# Patient Record
Sex: Female | Born: 1960 | Race: Black or African American | Hispanic: No | Marital: Single | Smoking: Current every day smoker
Health system: Southern US, Community
[De-identification: ages and names within clinical notes are randomized; demographics above are authoritative.]

## PROBLEM LIST (undated history)

## (undated) DIAGNOSIS — I2510 Atherosclerotic heart disease of native coronary artery without angina pectoris: Secondary | ICD-10-CM

## (undated) DIAGNOSIS — M069 Rheumatoid arthritis, unspecified: Secondary | ICD-10-CM

## (undated) DIAGNOSIS — M545 Low back pain, unspecified: Secondary | ICD-10-CM

## (undated) DIAGNOSIS — A048 Other specified bacterial intestinal infections: Secondary | ICD-10-CM

## (undated) DIAGNOSIS — K8020 Calculus of gallbladder without cholecystitis without obstruction: Secondary | ICD-10-CM

## (undated) DIAGNOSIS — M797 Fibromyalgia: Secondary | ICD-10-CM

## (undated) DIAGNOSIS — G8929 Other chronic pain: Secondary | ICD-10-CM

## (undated) DIAGNOSIS — M329 Systemic lupus erythematosus, unspecified: Secondary | ICD-10-CM

## (undated) DIAGNOSIS — I10 Essential (primary) hypertension: Secondary | ICD-10-CM

## (undated) DIAGNOSIS — I219 Acute myocardial infarction, unspecified: Secondary | ICD-10-CM

## (undated) DIAGNOSIS — R112 Nausea with vomiting, unspecified: Secondary | ICD-10-CM

## (undated) DIAGNOSIS — F419 Anxiety disorder, unspecified: Secondary | ICD-10-CM

## (undated) DIAGNOSIS — K5790 Diverticulosis of intestine, part unspecified, without perforation or abscess without bleeding: Secondary | ICD-10-CM

## (undated) DIAGNOSIS — E785 Hyperlipidemia, unspecified: Secondary | ICD-10-CM

## (undated) HISTORY — DX: Other specified bacterial intestinal infections: A04.8

## (undated) HISTORY — DX: Acute myocardial infarction, unspecified: I21.9

## (undated) HISTORY — DX: Hyperlipidemia, unspecified: E78.5

## (undated) HISTORY — DX: Calculus of gallbladder without cholecystitis without obstruction: K80.20

## (undated) HISTORY — DX: Diverticulosis of intestine, part unspecified, without perforation or abscess without bleeding: K57.90

## (undated) HISTORY — PX: MASTECTOMY: SHX3

## (undated) HISTORY — DX: Anxiety disorder, unspecified: F41.9

---

## 2000-03-26 ENCOUNTER — Encounter: Admission: RE | Admit: 2000-03-26 | Discharge: 2000-04-10 | Payer: Self-pay | Admitting: Orthopaedic Surgery

## 2000-07-04 ENCOUNTER — Inpatient Hospital Stay (HOSPITAL_COMMUNITY): Admission: EM | Admit: 2000-07-04 | Discharge: 2000-07-06 | Payer: Self-pay | Admitting: Emergency Medicine

## 2000-08-22 ENCOUNTER — Ambulatory Visit (HOSPITAL_COMMUNITY): Admission: RE | Admit: 2000-08-22 | Discharge: 2000-08-22 | Payer: Self-pay

## 2000-08-22 ENCOUNTER — Encounter: Admission: RE | Admit: 2000-08-22 | Discharge: 2000-08-22 | Payer: Self-pay | Admitting: Internal Medicine

## 2001-06-23 ENCOUNTER — Emergency Department (HOSPITAL_COMMUNITY): Admission: EM | Admit: 2001-06-23 | Discharge: 2001-06-23 | Payer: Self-pay

## 2001-11-26 ENCOUNTER — Encounter: Payer: Self-pay | Admitting: Family Medicine

## 2001-11-26 ENCOUNTER — Encounter: Admission: RE | Admit: 2001-11-26 | Discharge: 2001-11-26 | Payer: Self-pay | Admitting: Family Medicine

## 2001-12-15 ENCOUNTER — Encounter: Payer: Self-pay | Admitting: Family Medicine

## 2001-12-15 ENCOUNTER — Encounter: Admission: RE | Admit: 2001-12-15 | Discharge: 2001-12-15 | Payer: Self-pay | Admitting: Family Medicine

## 2002-02-02 ENCOUNTER — Inpatient Hospital Stay (HOSPITAL_COMMUNITY): Admission: EM | Admit: 2002-02-02 | Discharge: 2002-02-03 | Payer: Self-pay

## 2002-05-06 ENCOUNTER — Encounter: Admission: RE | Admit: 2002-05-06 | Discharge: 2002-05-06 | Payer: Self-pay | Admitting: Internal Medicine

## 2002-05-12 ENCOUNTER — Encounter: Payer: Self-pay | Admitting: Internal Medicine

## 2002-05-12 ENCOUNTER — Ambulatory Visit (HOSPITAL_COMMUNITY): Admission: RE | Admit: 2002-05-12 | Discharge: 2002-05-12 | Payer: Self-pay | Admitting: Internal Medicine

## 2002-05-30 ENCOUNTER — Inpatient Hospital Stay (HOSPITAL_COMMUNITY): Admission: EM | Admit: 2002-05-30 | Discharge: 2002-06-01 | Payer: Self-pay | Admitting: Emergency Medicine

## 2002-05-31 ENCOUNTER — Encounter: Payer: Self-pay | Admitting: Internal Medicine

## 2002-07-23 ENCOUNTER — Emergency Department (HOSPITAL_COMMUNITY): Admission: EM | Admit: 2002-07-23 | Discharge: 2002-07-23 | Payer: Self-pay | Admitting: Emergency Medicine

## 2002-08-19 ENCOUNTER — Encounter: Admission: RE | Admit: 2002-08-19 | Discharge: 2002-08-19 | Payer: Self-pay | Admitting: Internal Medicine

## 2002-12-20 ENCOUNTER — Encounter: Admission: RE | Admit: 2002-12-20 | Discharge: 2002-12-20 | Payer: Self-pay | Admitting: Internal Medicine

## 2002-12-20 ENCOUNTER — Encounter: Payer: Self-pay | Admitting: Internal Medicine

## 2003-04-20 ENCOUNTER — Emergency Department (HOSPITAL_COMMUNITY): Admission: EM | Admit: 2003-04-20 | Discharge: 2003-04-21 | Payer: Self-pay | Admitting: Emergency Medicine

## 2003-04-21 ENCOUNTER — Encounter: Admission: RE | Admit: 2003-04-21 | Discharge: 2003-04-21 | Payer: Self-pay | Admitting: Internal Medicine

## 2003-05-20 ENCOUNTER — Encounter: Admission: RE | Admit: 2003-05-20 | Discharge: 2003-05-20 | Payer: Self-pay | Admitting: Internal Medicine

## 2003-07-04 ENCOUNTER — Encounter: Admission: RE | Admit: 2003-07-04 | Discharge: 2003-07-04 | Payer: Self-pay | Admitting: Internal Medicine

## 2003-12-29 ENCOUNTER — Encounter: Admission: RE | Admit: 2003-12-29 | Discharge: 2003-12-29 | Payer: Self-pay | Admitting: Internal Medicine

## 2003-12-29 ENCOUNTER — Encounter (INDEPENDENT_AMBULATORY_CARE_PROVIDER_SITE_OTHER): Payer: Self-pay | Admitting: Internal Medicine

## 2004-01-03 ENCOUNTER — Encounter: Admission: RE | Admit: 2004-01-03 | Discharge: 2004-01-03 | Payer: Self-pay | Admitting: Internal Medicine

## 2004-04-07 ENCOUNTER — Emergency Department (HOSPITAL_COMMUNITY): Admission: EM | Admit: 2004-04-07 | Discharge: 2004-04-07 | Payer: Self-pay | Admitting: Emergency Medicine

## 2004-05-14 ENCOUNTER — Encounter: Admission: RE | Admit: 2004-05-14 | Discharge: 2004-05-14 | Payer: Self-pay | Admitting: Internal Medicine

## 2004-05-16 ENCOUNTER — Ambulatory Visit (HOSPITAL_COMMUNITY): Admission: RE | Admit: 2004-05-16 | Discharge: 2004-05-16 | Payer: Self-pay | Admitting: Internal Medicine

## 2004-05-16 ENCOUNTER — Encounter: Admission: RE | Admit: 2004-05-16 | Discharge: 2004-05-16 | Payer: Self-pay | Admitting: Sports Medicine

## 2004-11-09 ENCOUNTER — Ambulatory Visit: Payer: Self-pay | Admitting: Internal Medicine

## 2004-11-09 ENCOUNTER — Inpatient Hospital Stay (HOSPITAL_COMMUNITY): Admission: EM | Admit: 2004-11-09 | Discharge: 2004-11-10 | Payer: Self-pay | Admitting: *Deleted

## 2005-01-16 ENCOUNTER — Ambulatory Visit: Payer: Self-pay | Admitting: Internal Medicine

## 2005-01-16 ENCOUNTER — Ambulatory Visit (HOSPITAL_COMMUNITY): Admission: RE | Admit: 2005-01-16 | Discharge: 2005-01-16 | Payer: Self-pay | Admitting: Internal Medicine

## 2005-12-27 ENCOUNTER — Ambulatory Visit: Payer: Self-pay | Admitting: Internal Medicine

## 2006-09-01 ENCOUNTER — Encounter (INDEPENDENT_AMBULATORY_CARE_PROVIDER_SITE_OTHER): Payer: Self-pay | Admitting: Internal Medicine

## 2006-09-01 DIAGNOSIS — M899 Disorder of bone, unspecified: Secondary | ICD-10-CM | POA: Insufficient documentation

## 2006-09-01 DIAGNOSIS — M949 Disorder of cartilage, unspecified: Secondary | ICD-10-CM

## 2006-09-01 DIAGNOSIS — M329 Systemic lupus erythematosus, unspecified: Secondary | ICD-10-CM | POA: Insufficient documentation

## 2006-09-01 DIAGNOSIS — G729 Myopathy, unspecified: Secondary | ICD-10-CM

## 2006-09-01 DIAGNOSIS — I10 Essential (primary) hypertension: Secondary | ICD-10-CM | POA: Insufficient documentation

## 2006-09-01 DIAGNOSIS — IMO0001 Reserved for inherently not codable concepts without codable children: Secondary | ICD-10-CM

## 2006-09-01 DIAGNOSIS — K219 Gastro-esophageal reflux disease without esophagitis: Secondary | ICD-10-CM

## 2006-09-01 DIAGNOSIS — N951 Menopausal and female climacteric states: Secondary | ICD-10-CM

## 2006-09-01 DIAGNOSIS — M545 Low back pain: Secondary | ICD-10-CM

## 2006-09-01 DIAGNOSIS — E881 Lipodystrophy, not elsewhere classified: Secondary | ICD-10-CM

## 2006-10-16 ENCOUNTER — Telehealth: Payer: Self-pay | Admitting: *Deleted

## 2007-01-07 ENCOUNTER — Ambulatory Visit: Payer: Self-pay | Admitting: Internal Medicine

## 2007-01-07 DIAGNOSIS — M549 Dorsalgia, unspecified: Secondary | ICD-10-CM | POA: Insufficient documentation

## 2007-01-07 DIAGNOSIS — R350 Frequency of micturition: Secondary | ICD-10-CM

## 2007-01-08 ENCOUNTER — Encounter (INDEPENDENT_AMBULATORY_CARE_PROVIDER_SITE_OTHER): Payer: Self-pay | Admitting: Internal Medicine

## 2007-01-08 LAB — CONVERTED CEMR LAB
Albumin: 4.4 g/dL (ref 3.5–5.2)
Alkaline Phosphatase: 66 units/L (ref 39–117)
BUN: 12 mg/dL (ref 6–23)
Benzodiazepines.: NEGATIVE
Bilirubin Urine: NEGATIVE
CO2: 25 meq/L (ref 19–32)
Creatinine,U: 168.5 mg/dL
Glucose, Bld: 78 mg/dL (ref 70–99)
Leukocytes, UA: NEGATIVE
Marijuana Metabolite: POSITIVE — AB
Methadone: NEGATIVE
Phencyclidine (PCP): NEGATIVE
Potassium: 4.4 meq/L (ref 3.5–5.3)
Propoxyphene: NEGATIVE
Protein, ur: NEGATIVE mg/dL
Specific Gravity, Urine: 1.022 (ref 1.005–1.03)
Total Bilirubin: 0.2 mg/dL — ABNORMAL LOW (ref 0.3–1.2)
Urobilinogen, UA: 0.2 (ref 0.0–1.0)

## 2007-01-14 ENCOUNTER — Ambulatory Visit (HOSPITAL_COMMUNITY): Admission: RE | Admit: 2007-01-14 | Discharge: 2007-01-14 | Payer: Self-pay | Admitting: Gynecology

## 2007-01-14 ENCOUNTER — Encounter (INDEPENDENT_AMBULATORY_CARE_PROVIDER_SITE_OTHER): Payer: Self-pay | Admitting: Internal Medicine

## 2007-01-15 ENCOUNTER — Telehealth (INDEPENDENT_AMBULATORY_CARE_PROVIDER_SITE_OTHER): Payer: Self-pay | Admitting: *Deleted

## 2007-09-17 DIAGNOSIS — I219 Acute myocardial infarction, unspecified: Secondary | ICD-10-CM

## 2007-09-17 HISTORY — PX: CORONARY ANGIOPLASTY WITH STENT PLACEMENT: SHX49

## 2007-09-17 HISTORY — DX: Acute myocardial infarction, unspecified: I21.9

## 2008-02-23 ENCOUNTER — Ambulatory Visit: Payer: Self-pay | Admitting: Hospitalist

## 2008-02-23 ENCOUNTER — Inpatient Hospital Stay (HOSPITAL_COMMUNITY): Admission: EM | Admit: 2008-02-23 | Discharge: 2008-02-24 | Payer: Self-pay | Admitting: Emergency Medicine

## 2008-02-25 ENCOUNTER — Encounter (INDEPENDENT_AMBULATORY_CARE_PROVIDER_SITE_OTHER): Payer: Self-pay | Admitting: Internal Medicine

## 2008-03-03 ENCOUNTER — Ambulatory Visit: Payer: Self-pay | Admitting: Internal Medicine

## 2008-03-03 ENCOUNTER — Encounter (INDEPENDENT_AMBULATORY_CARE_PROVIDER_SITE_OTHER): Payer: Self-pay | Admitting: Internal Medicine

## 2008-03-03 DIAGNOSIS — F17200 Nicotine dependence, unspecified, uncomplicated: Secondary | ICD-10-CM | POA: Insufficient documentation

## 2008-03-03 DIAGNOSIS — K7689 Other specified diseases of liver: Secondary | ICD-10-CM

## 2008-03-03 DIAGNOSIS — D4959 Neoplasm of unspecified behavior of other genitourinary organ: Secondary | ICD-10-CM | POA: Insufficient documentation

## 2008-03-04 ENCOUNTER — Encounter (INDEPENDENT_AMBULATORY_CARE_PROVIDER_SITE_OTHER): Payer: Self-pay | Admitting: Internal Medicine

## 2008-03-04 ENCOUNTER — Telehealth: Payer: Self-pay | Admitting: Licensed Clinical Social Worker

## 2008-03-04 LAB — CONVERTED CEMR LAB
AST: 15 units/L (ref 0–37)
Albumin: 4 g/dL (ref 3.5–5.2)
BUN: 8 mg/dL (ref 6–23)
Basophils Relative: 0 % (ref 0–1)
CO2: 23 meq/L (ref 19–32)
Calcium: 9 mg/dL (ref 8.4–10.5)
Chloride: 104 meq/L (ref 96–112)
Creatinine, Ser: 0.7 mg/dL (ref 0.40–1.20)
Hemoglobin: 11.9 g/dL — ABNORMAL LOW (ref 12.0–15.0)
Lymphocytes Relative: 27 % (ref 12–46)
Lymphs Abs: 1.9 10*3/uL (ref 0.7–4.0)
Monocytes Absolute: 0.8 10*3/uL (ref 0.1–1.0)
Monocytes Relative: 11 % (ref 3–12)
Neutro Abs: 4.1 10*3/uL (ref 1.7–7.7)
Neutrophils Relative %: 58 % (ref 43–77)
Potassium: 4 meq/L (ref 3.5–5.3)
RBC: 3.77 M/uL — ABNORMAL LOW (ref 3.87–5.11)
WBC: 7 10*3/uL (ref 4.0–10.5)

## 2008-06-01 ENCOUNTER — Ambulatory Visit (HOSPITAL_COMMUNITY): Admission: RE | Admit: 2008-06-01 | Discharge: 2008-06-01 | Payer: Self-pay | Admitting: Internal Medicine

## 2008-06-27 ENCOUNTER — Telehealth (INDEPENDENT_AMBULATORY_CARE_PROVIDER_SITE_OTHER): Payer: Self-pay | Admitting: Internal Medicine

## 2008-07-04 ENCOUNTER — Inpatient Hospital Stay (HOSPITAL_COMMUNITY): Admission: EM | Admit: 2008-07-04 | Discharge: 2008-07-08 | Payer: Self-pay | Admitting: Emergency Medicine

## 2008-07-04 DIAGNOSIS — I252 Old myocardial infarction: Secondary | ICD-10-CM

## 2008-07-25 ENCOUNTER — Emergency Department (HOSPITAL_COMMUNITY): Admission: EM | Admit: 2008-07-25 | Discharge: 2008-07-25 | Payer: Self-pay | Admitting: Family Medicine

## 2008-08-01 ENCOUNTER — Telehealth: Payer: Self-pay | Admitting: *Deleted

## 2008-08-01 ENCOUNTER — Telehealth (INDEPENDENT_AMBULATORY_CARE_PROVIDER_SITE_OTHER): Payer: Self-pay | Admitting: *Deleted

## 2008-08-04 ENCOUNTER — Encounter (INDEPENDENT_AMBULATORY_CARE_PROVIDER_SITE_OTHER): Payer: Self-pay | Admitting: Internal Medicine

## 2008-08-04 ENCOUNTER — Ambulatory Visit: Payer: Self-pay | Admitting: *Deleted

## 2008-08-04 ENCOUNTER — Ambulatory Visit (HOSPITAL_COMMUNITY): Admission: RE | Admit: 2008-08-04 | Discharge: 2008-08-04 | Payer: Self-pay | Admitting: *Deleted

## 2008-08-23 ENCOUNTER — Emergency Department (HOSPITAL_COMMUNITY): Admission: EM | Admit: 2008-08-23 | Discharge: 2008-08-23 | Payer: Self-pay | Admitting: Emergency Medicine

## 2008-11-17 ENCOUNTER — Encounter (INDEPENDENT_AMBULATORY_CARE_PROVIDER_SITE_OTHER): Payer: Self-pay | Admitting: Internal Medicine

## 2008-11-17 ENCOUNTER — Ambulatory Visit: Payer: Self-pay | Admitting: Internal Medicine

## 2008-11-17 LAB — CONVERTED CEMR LAB: Hgb A1c MFr Bld: 5.6 %

## 2008-11-22 DIAGNOSIS — D72829 Elevated white blood cell count, unspecified: Secondary | ICD-10-CM | POA: Insufficient documentation

## 2008-11-22 LAB — CONVERTED CEMR LAB
ALT: 22 units/L (ref 0–35)
AST: 22 units/L (ref 0–37)
Band Neutrophils: 0 % (ref 0–10)
Basophils Relative: 0 % (ref 0–1)
Creatinine, Ser: 0.73 mg/dL (ref 0.40–1.20)
Eosinophils Absolute: 0.2 10*3/uL (ref 0.0–0.7)
Hemoglobin: 11.7 g/dL — ABNORMAL LOW (ref 12.0–15.0)
LDL Cholesterol: 17 mg/dL (ref 0–99)
Lymphs Abs: 1.9 10*3/uL (ref 0.7–4.0)
MCHC: 33.1 g/dL (ref 30.0–36.0)
Neutro Abs: 9.1 10*3/uL — ABNORMAL HIGH (ref 1.7–7.7)
Neutrophils Relative %: 77 % (ref 43–77)
RBC: 3.82 M/uL — ABNORMAL LOW (ref 3.87–5.11)
RDW: 13.6 % (ref 11.5–15.5)
Sodium: 141 meq/L (ref 135–145)
TSH: 1.353 microintl units/mL (ref 0.350–4.50)
Total Bilirubin: 0.2 mg/dL — ABNORMAL LOW (ref 0.3–1.2)
Total CHOL/HDL Ratio: 1.4
Total Protein: 7.8 g/dL (ref 6.0–8.3)
VLDL: 10 mg/dL (ref 0–40)

## 2008-12-08 ENCOUNTER — Encounter (INDEPENDENT_AMBULATORY_CARE_PROVIDER_SITE_OTHER): Payer: Self-pay | Admitting: Internal Medicine

## 2008-12-08 ENCOUNTER — Ambulatory Visit: Payer: Self-pay | Admitting: *Deleted

## 2008-12-08 LAB — CONVERTED CEMR LAB
Anticardiolipin IgA: 19 (ref <13)
Candida species: NEGATIVE
Chlamydia, DNA Probe: NEGATIVE
GC Probe Amp, Genital: NEGATIVE
HCT: 36.9 % (ref 36.0–46.0)
Hemoglobin: 12.7 g/dL (ref 12.0–15.0)
Platelets: 268 10*3/uL (ref 150–400)
RBC: 3.9 M/uL (ref 3.87–5.11)
WBC: 6 10*3/uL (ref 4.0–10.5)

## 2009-02-08 ENCOUNTER — Telehealth (INDEPENDENT_AMBULATORY_CARE_PROVIDER_SITE_OTHER): Payer: Self-pay | Admitting: Internal Medicine

## 2009-02-15 ENCOUNTER — Encounter (INDEPENDENT_AMBULATORY_CARE_PROVIDER_SITE_OTHER): Payer: Self-pay | Admitting: Internal Medicine

## 2009-02-16 ENCOUNTER — Encounter (INDEPENDENT_AMBULATORY_CARE_PROVIDER_SITE_OTHER): Payer: Self-pay | Admitting: Internal Medicine

## 2009-02-16 ENCOUNTER — Ambulatory Visit: Payer: Self-pay | Admitting: Internal Medicine

## 2009-02-16 DIAGNOSIS — G4700 Insomnia, unspecified: Secondary | ICD-10-CM | POA: Insufficient documentation

## 2009-02-16 LAB — CONVERTED CEMR LAB
AST: 30 units/L (ref 0–37)
Albumin: 4.3 g/dL (ref 3.5–5.2)
BUN: 10 mg/dL (ref 6–23)
CO2: 20 meq/L (ref 19–32)
Calcium: 9.2 mg/dL (ref 8.4–10.5)
Chloride: 103 meq/L (ref 96–112)
Potassium: 3.9 meq/L (ref 3.5–5.3)
Total CK: 50 units/L (ref 7–177)

## 2009-04-06 ENCOUNTER — Telehealth: Payer: Self-pay | Admitting: Internal Medicine

## 2009-05-16 ENCOUNTER — Telehealth (INDEPENDENT_AMBULATORY_CARE_PROVIDER_SITE_OTHER): Payer: Self-pay | Admitting: *Deleted

## 2009-05-25 ENCOUNTER — Telehealth (INDEPENDENT_AMBULATORY_CARE_PROVIDER_SITE_OTHER): Payer: Self-pay | Admitting: *Deleted

## 2009-05-25 ENCOUNTER — Telehealth: Payer: Self-pay | Admitting: *Deleted

## 2009-09-19 ENCOUNTER — Emergency Department (HOSPITAL_COMMUNITY): Admission: EM | Admit: 2009-09-19 | Discharge: 2009-09-19 | Payer: Self-pay | Admitting: Emergency Medicine

## 2009-09-22 ENCOUNTER — Emergency Department (HOSPITAL_COMMUNITY): Admission: EM | Admit: 2009-09-22 | Discharge: 2009-09-22 | Payer: Self-pay | Admitting: Family Medicine

## 2009-09-26 ENCOUNTER — Emergency Department (HOSPITAL_COMMUNITY): Admission: EM | Admit: 2009-09-26 | Discharge: 2009-09-26 | Payer: Self-pay | Admitting: Emergency Medicine

## 2009-10-06 ENCOUNTER — Emergency Department (HOSPITAL_COMMUNITY): Admission: EM | Admit: 2009-10-06 | Discharge: 2009-10-06 | Payer: Self-pay | Admitting: Emergency Medicine

## 2009-12-01 ENCOUNTER — Encounter: Payer: Self-pay | Admitting: Internal Medicine

## 2009-12-01 ENCOUNTER — Inpatient Hospital Stay (HOSPITAL_COMMUNITY): Admission: EM | Admit: 2009-12-01 | Discharge: 2009-12-03 | Payer: Self-pay | Admitting: Emergency Medicine

## 2009-12-03 ENCOUNTER — Encounter (INDEPENDENT_AMBULATORY_CARE_PROVIDER_SITE_OTHER): Payer: Self-pay | Admitting: Internal Medicine

## 2009-12-06 ENCOUNTER — Telehealth (INDEPENDENT_AMBULATORY_CARE_PROVIDER_SITE_OTHER): Payer: Self-pay | Admitting: Internal Medicine

## 2010-03-05 ENCOUNTER — Telehealth: Payer: Self-pay | Admitting: Internal Medicine

## 2010-05-05 ENCOUNTER — Ambulatory Visit: Payer: Self-pay | Admitting: Internal Medicine

## 2010-05-05 ENCOUNTER — Inpatient Hospital Stay (HOSPITAL_COMMUNITY): Admission: EM | Admit: 2010-05-05 | Discharge: 2010-05-07 | Payer: Self-pay | Admitting: Internal Medicine

## 2010-05-05 ENCOUNTER — Encounter: Payer: Self-pay | Admitting: Internal Medicine

## 2010-06-06 IMAGING — US US ABDOMEN LIMITED
1 series · 14 of 25 positions shown · non-contrast
Comparison: 02/23/2008 ultrasound and MRI

CLINICAL DATA: 46-year-old female small left hepatic lesions,
follow-up exam

ABDOMEN ULTRASOUND LIMITED

[Series 1: unknown · 0.28mm/px · 14 of 30 slices shown]
[im 1/30]
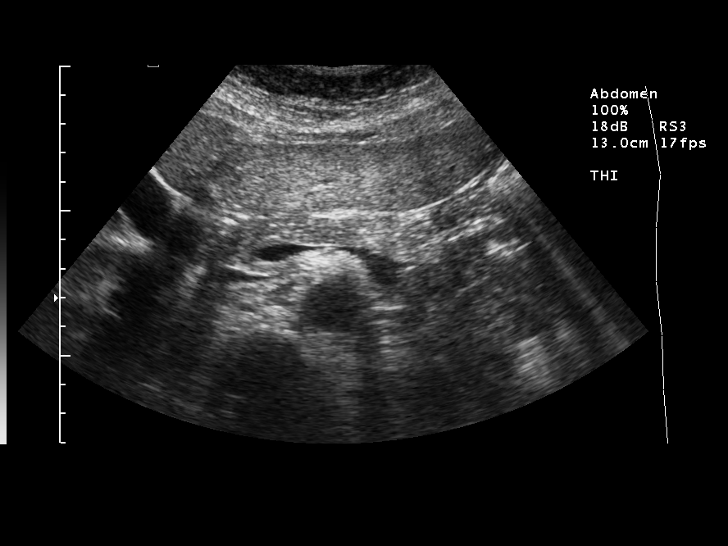
[im 3/30]
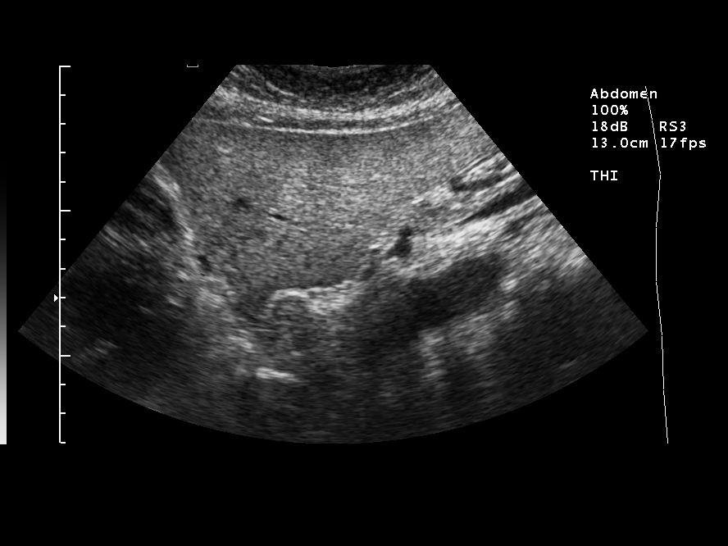
[im 5/30]
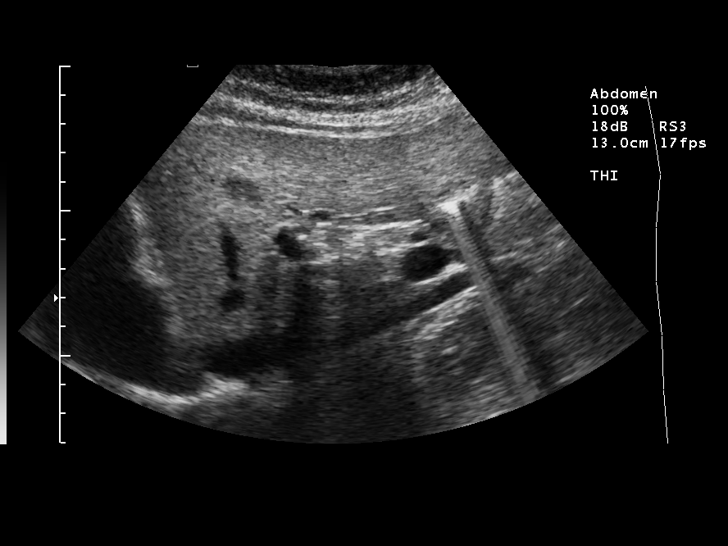
[im 8/30]
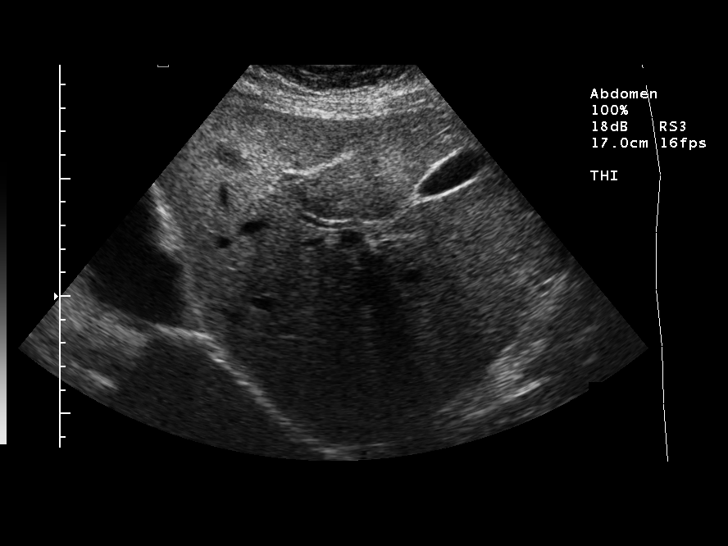
[im 10/30]
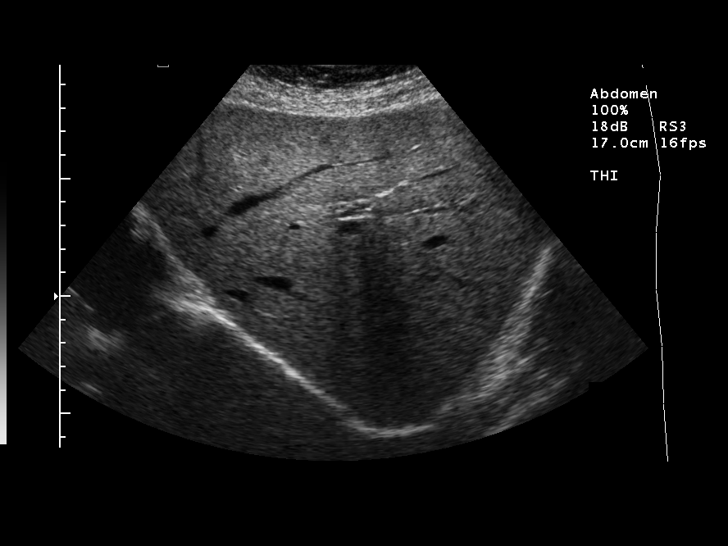
[im 11/30]
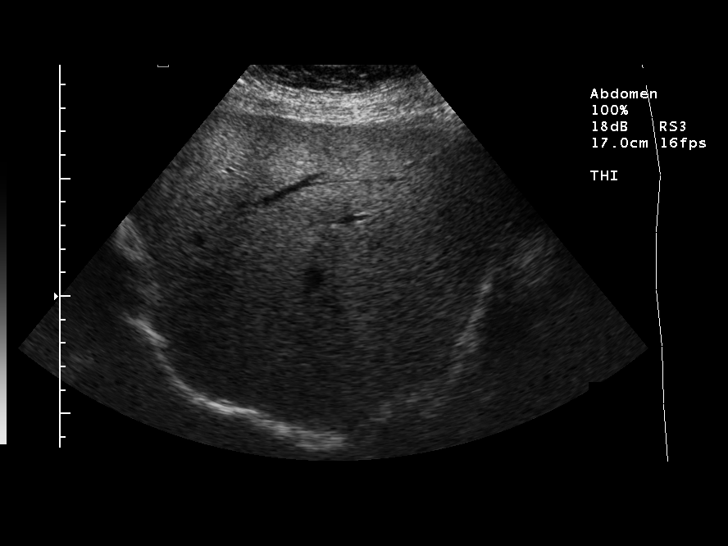
[im 14/30]
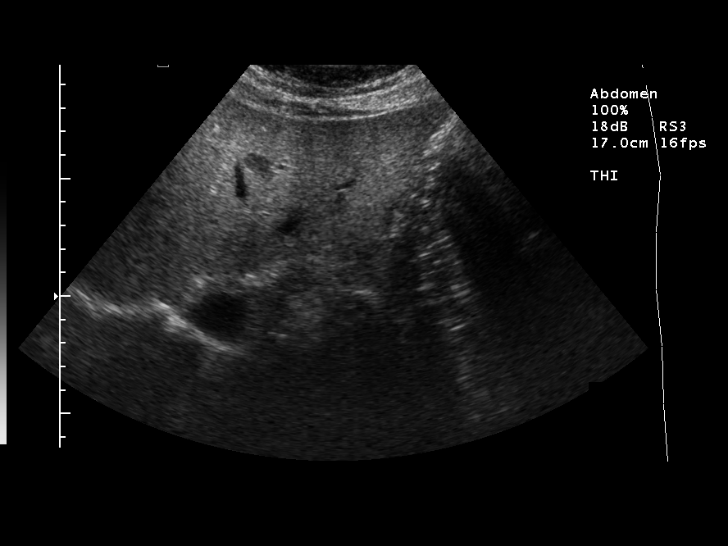
[im 16/30]
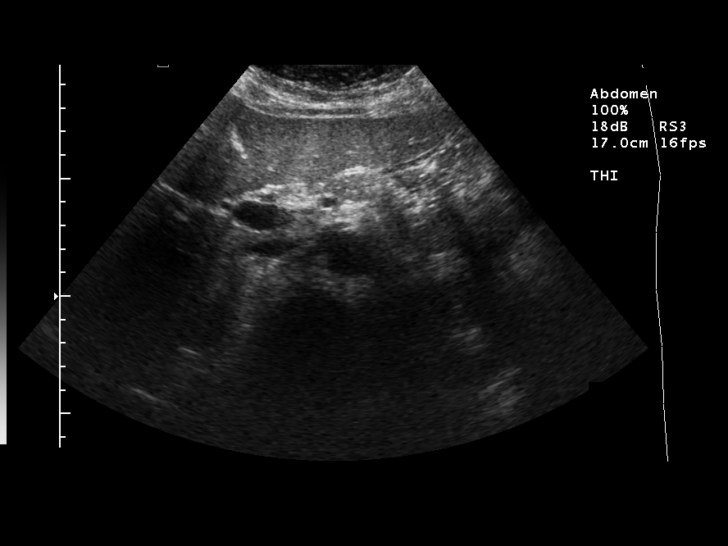
[im 19/30]
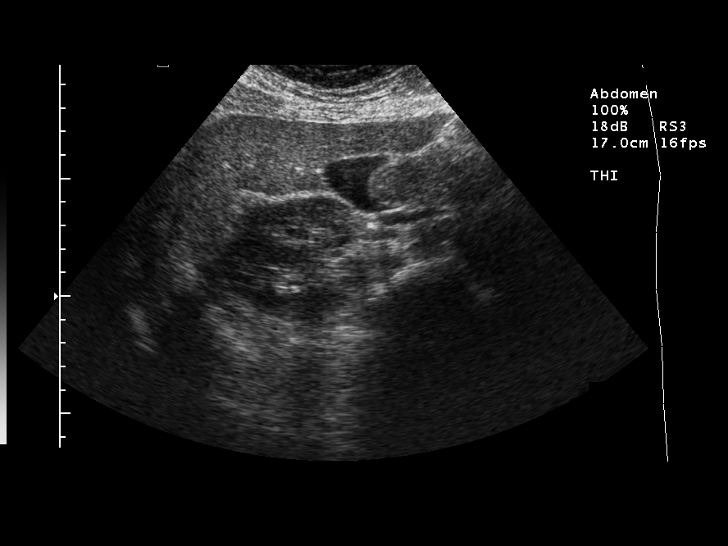
[im 20/30]
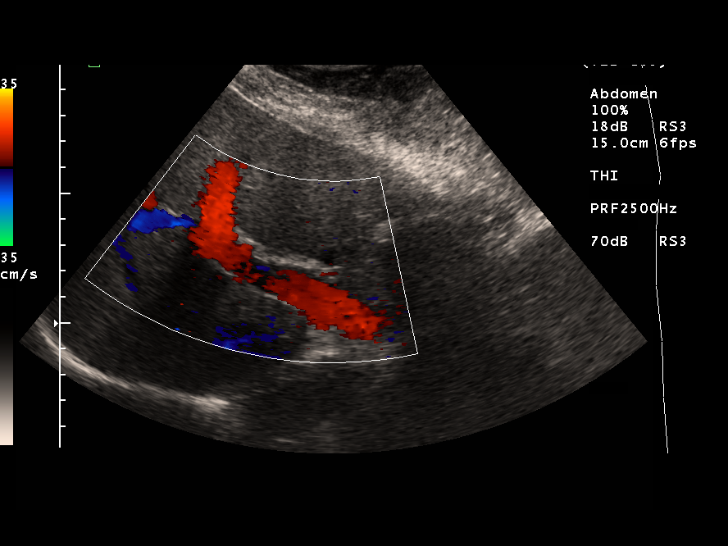
[im 22/30]
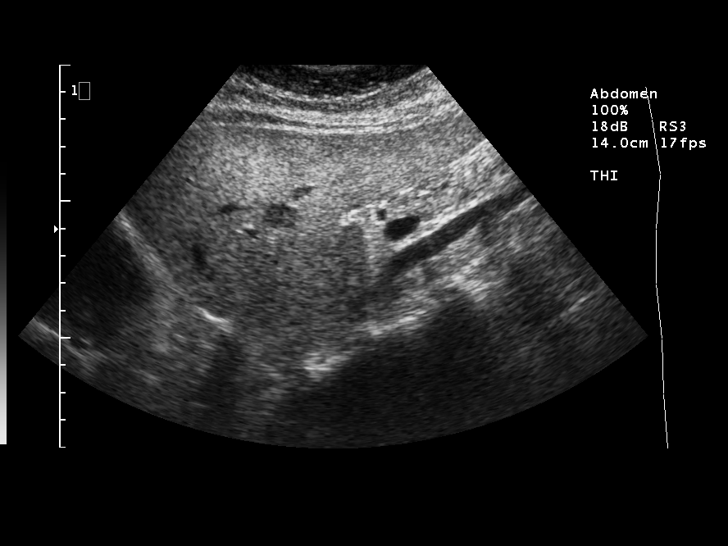
[im 25/30]
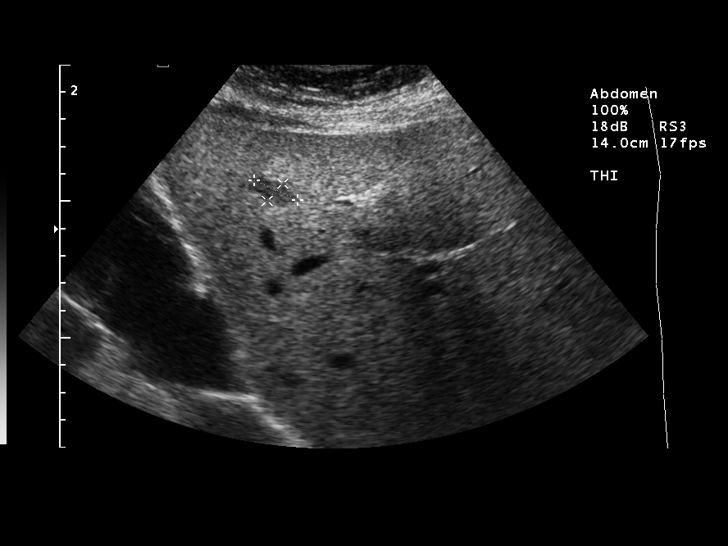
[im 27/30]
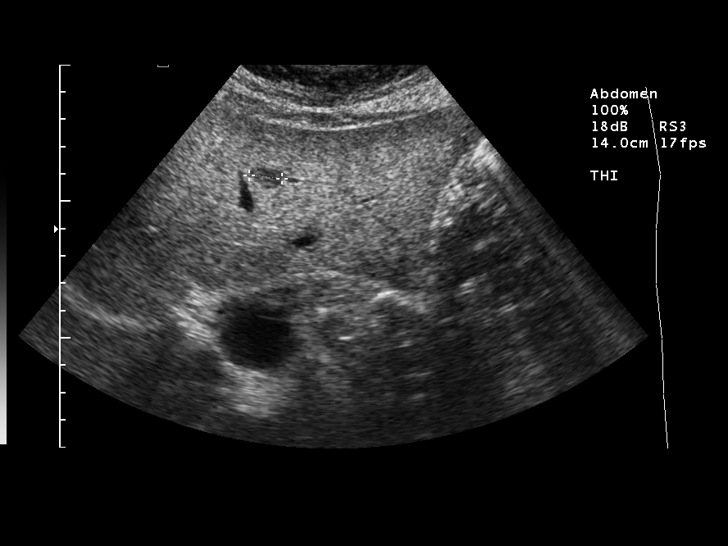
[im 30/30]
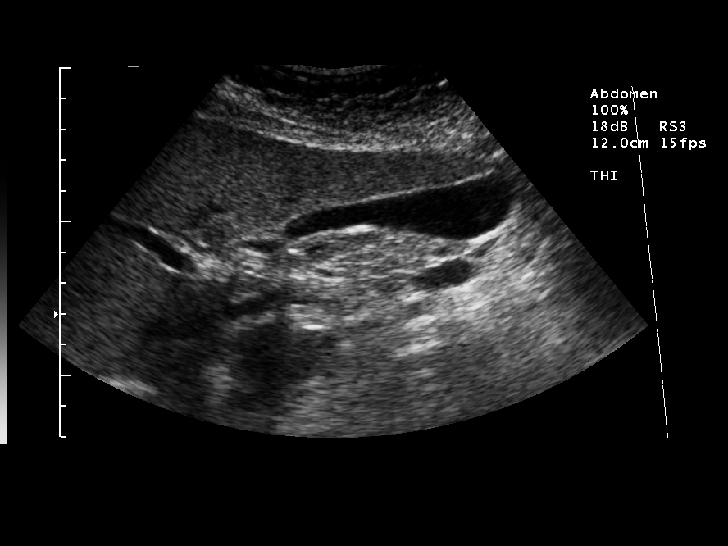

[14 of 25 positions shown; findings below may reference images not displayed]

FINDINGS: The liver demonstrates two small hypoechoic solid nodules
one measuring 15 mm and second measuring 17 mm.  These appear
stable compared to 02/23/2008.  No additional lesions visualized.
No biliary dilatation.  No interval enlargement of the nodules.  No
free fluid.  Gallbladder is normal.
IMPRESSION: 2 small stable hypoechoic liver nodules compared to 02/23/2008

## 2010-08-09 ENCOUNTER — Encounter: Payer: Self-pay | Admitting: Internal Medicine

## 2010-08-09 ENCOUNTER — Inpatient Hospital Stay (HOSPITAL_COMMUNITY): Admission: EM | Admit: 2010-08-09 | Discharge: 2010-08-10 | Payer: Self-pay | Admitting: Internal Medicine

## 2010-08-09 ENCOUNTER — Encounter: Payer: Self-pay | Admitting: Emergency Medicine

## 2010-08-10 ENCOUNTER — Encounter: Payer: Self-pay | Admitting: Internal Medicine

## 2010-08-20 ENCOUNTER — Telehealth (INDEPENDENT_AMBULATORY_CARE_PROVIDER_SITE_OTHER): Payer: Self-pay | Admitting: *Deleted

## 2010-08-30 ENCOUNTER — Ambulatory Visit (HOSPITAL_COMMUNITY)
Admission: RE | Admit: 2010-08-30 | Discharge: 2010-08-30 | Payer: Self-pay | Source: Home / Self Care | Attending: Internal Medicine | Admitting: Internal Medicine

## 2010-10-07 ENCOUNTER — Encounter: Payer: Self-pay | Admitting: Internal Medicine

## 2010-10-16 NOTE — Progress Notes (Signed)
Summary: NO LONGER A PATIENT  Phone Note Call from Patient   Summary of Call: Patient has called in and stated she is no longer  patient of this clinic.  She now sees Dr. Lonia Blood. Initial call taken by: Shon Hough,  August 20, 2010 8:56 AM

## 2010-10-16 NOTE — Miscellaneous (Signed)
Summary: Hospital Admission  INTERNAL MEDICINE ADMISSION HISTORY AND PHYSICAL PCP: Unasigned [id number], Vomiting HPI: [name] is a 50 year old woman with significant past medical history for CAD, Discoid Lupus, GERD that went to  [facility] emergency department complaining of nausea and vomiting. It started yesterday morning, is yellow, non bloody in nature . According to her she vomited up 50-60times. She also had 3-4 episodes of watery, nonbloddy diarrhea yesterday. Also complains of fever and chills that started yesterday morning also. Palpitations and dizziness for the same period of time are also present. Upon further questioning she admits weight loss of approximately 20-25 pounds in the last year. Night sweats are also present for the last 6 months.approximately.   Patient received in the ED IV fluids, Zofran and Vicodin.  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   MEDICATIONS: Current Meds:  METOPROLOL TARTRATE 50 MG TABS (METOPROLOL TARTRATE) Take 1 tablet by mouth two times a day SIMVASTATIN 20 MG TABS (SIMVASTATIN) Take 1 tab by mouth at bedtime NITROGLYCERIN 0.4 MG SUBL (NITROGLYCERIN) as needed PLAVIX 75 MG TABS (CLOPIDOGREL BISULFATE) Take 1 tablet by mouth once a day ANACIN 81 MG TBEC (ASPIRIN) Take 1 tablet by mouth once a  day COZAAR 50 MG TABS (LOSARTAN POTASSIUM) Take 1 tablet by mouth once a day AMBIEN 5 MG TABS (ZOLPIDEM TARTRATE) bedtime.   SOCIAL HISTORY: Smokes half pack per day, for 30years Max drinks per week: 3  FAMILY HISTORY  Mother passed away @ 45. Ca Stomach Brother had brain ca   ROS (only + for ): Tooth pain Dyspnea on exertion Cough Sputum: white  Anorexia Dysphagia Joint Pain  VITALS: T: 97.6 P:65  BP:109/70  R: 20 O2SAT:98  ON:RA PHYSICAL EXAM: General:  alert, well-developed, and cooperative to examination.   Head:  normocephalic and atraumatic.   Eyes:  vision grossly intact, pupils equal, pupils round, pupils reactive to light, no injection and anicteric.   Mouth:  pharynx pink and moist, no erythema, and no exudates.   Neck:  supple, full ROM, no thyromegaly, no JVD, and no carotid bruits.   Lungs:  normal respiratory effort, no accessory muscle use, normal breath sounds, no crackles, and no wheezes. CV: RRR, no M, S3, S4, no lifts or rubs. Abdomen: ND; BS+, NTTP, no HSM MSK: FROM of all extremities proximately and distally bialterally; no joint erythema, effusion or increased warmth to touch bilaterally. Neurologic:  alert & oriented X3, cranial nerves II-XII intact, strength normal in all extremities, sensation intact to light touch, and gait normal.   Skin:  turgor normal and no rashes.   Psych:  Oriented X3, memory intact for recent and remote, normally interactive, good eye contact, not anxious appearing, and not depressed appearing.  LABS:  CBC.  WBC  12.4       h      4.0-10.5         K/uL  RBC                                      4.31              3.87-5.11        MIL/uL  Hemoglobin (HGB)                         13.9              12.0-15.0        g/dL  Hematocrit (HCT)                         39.8              36.0-46.0        %  MCV                                      92.5              78.0-100.0       fL  MCH -                                     32.4              26.0-34.0        pg  MCHC                                     35.0              30.0-36.0        g/dL  RDW                                      14.5              11.5-15.5        %  Platelet Count (PLT)                     346               150-400          K/uL  Neutrophils, %                           91         h      43-77            %  Lymphocytes, %                           7          l      12-46            %  Monocytes, %  2          l      3-12             %  Eosinophils, %                           0                 0-5              %  Basophils, %                             0                 0-1              %  Neutrophils, Absolute                    11.3       h      1.7-7.7          K/uL  Lymphocytes, Absolute                    0.9               0.7-4.0          K/uL  Monocytes, Absolute                      0.2               0.1-1.0          K/uL  Eosinophils, Absolute                    0.0               0.0-0.7          K/uL  Basophils, Absolute                      0.0               0.0-0.1          K/uL  Urinalysis  Color, Urine                             AMBER      a      YELLOW    BIOCHEMICALS MAY BE AFFECTED BY COLOR  Appearance                               CLOUDY     a      CLEAR  Specific Gravity                         1.028             1.005-1.030  pH                                       6.0               5.0-8.0  Urine Glucose  NEGATIVE          NEG              mg/dL  Bilirubin                                NEGATIVE          NEG  Ketones                                  NEGATIVE          NEG              mg/dL  Blood                                    SMALL      a      NEG  Protein                                  100        a      NEG              mg/dL  Urobilinogen                             0.2               0.0-1.0          mg/dL  Nitrite                                   NEGATIVE          NEG  Leukocytes                               NEGATIVE          NEG  CMP   Sodium (NA)                              145               135-145          mEq/L  Potassium (K)                            4.2               3.5-5.1          mEq/L  Chloride                                 106               96-112           mEq/L  CO2  22                19-32            mEq/L  Glucose                                  141        h      70-99            mg/dL  BUN                                      20                6-23             mg/dL  Creatinine                               0.77              0.4-1.2          mg/dL  GFR, Est Non African American            >60               >60              mL/min  GFR, Est African American                >60               >60              mL/min    Oversized comment, see footnote  1  Bilirubin, Total                         0.7               0.3-1.2          mg/dL  Alkaline Phosphatase                     64                39-117           U/L  SGOT (AST)                               36                0-37             U/L  SGPT (ALT)                               32                0-35             U/L  Total  Protein                           9.4        h      6.0-8.3  g/dL  Albumin-Blood                            5.0               3.5-5.2          g/dL  Calcium                                  10.1              8.4-10.5         mg/dL  Urine microscopic  Squamous Epithelial / LPF                MANY       a      RARE  Casts / HPF                              SEE NOTE.  a      NEG    HYALINE CASTS  WBC / HPF                                3-6               <3               WBC/hpf  RBC / HPF                                3-6               <3               RBC/hpf  Bacteria / HPF                           MANY       a      RARE  Urine-Other                              SEE  NOTE.    MUCOUS PRESENT  Abdominal Xray: Findings: The lungs are well-aerated and clear.  There is no   evidence of focal opacification, pleural effusion or pneumothorax.   The cardiomediastinal silhouette is within normal limits.    The visualized bowel gas pattern is unremarkable.  There is a   relative paucity of bowel gas within the abdomen; there is no   evidence of small bowel dilatation to suggest obstruction.  No free   intra-abdominal air is identified on the provided upright view.    No acute osseous abnormalities are seen; the sacroiliac joints are   unremarkable in appearance.  Calcification is noted along the   abdominal aorta, advanced for age.    IMPRESSION:    1.  Unremarkable bowel gas pattern; relative paucity of bowel gas   in the abdomen.  No free intra-abdominal air seen.   2.  No acute cardiopulmonary process identified.   3.  Calcification along the abdominal aorta, advanced for age.  ASSESSMENT AND PLAN: (1) Nausea/Vomiting: Most likely due to  infectious etiology (viral/bacterial gastroenteritis) because of fever and elevated  WBC with a left shift. Other causes  could be Esophageal Cancer (complains of Dysphagia and weight loss, PMH of GERD), pancreatitis (no abdominal pain), pancreatic adenocarcinoma (weight loss, no abdominal pain, no jaundice, no palpable gallbladder) GERD, alcoholic ketosis (drinks alcohol only ocassionally), starvation (has anorexia), DKA (not diabetic).  Vital Orthostatics, FOBT, Lipase, Abdominal US, Barium swallow.   (2) Weight Loss: Differential is broad. Could be any type of cancer: Esophageal Cancer (PMH GERD, Dysphagia), Hepatic Cancer (history of 2 hypoechoic lesions on abdominal US on 2009), HIV, Depression, hyperthyroidism    Will do HIV test, TSH.  (3) Night sweats: Could be due to a malignancy, infection (TB, HIV), menopausia.  (4)Discoid Lupus: She is in no current medication for Lupus. No malar rash. No renal  problems.  (5)History of myocardial infarction and coronary artery disease with stenting in 2009. Continue Home medications.  (6) GERD: Consider PPI.  (7) Dyslipidemia: Continue Simvastatin.   ()VTE PROPH: lovenox  ATTENDING PHYSICIAN: I discussed the case with the resident(s) as noted ad reviewed the resident's notes. I agree with the finding and plan - please refer to the attending physician note for more details.  Signature__________________________________________  Printed Name_______________________________________

## 2010-10-16 NOTE — Miscellaneous (Signed)
Summary: Hospital d/c  Hospital Discharge  Date of admission: 12/01/09  Date of discharge: 3/20  Brief reason for admission/active problems: 1. Noncardiac chest pain 2. Hypokalemia 3.  HLD-of note, simvastatin dose was decreased bc LDL was 19.    Followup needed:  Please make sure she has followed up with cardiology and has not had recurrent chest pain.  She will need a BMET as she had hypokalemia that was resolved at d/c.  The medication and problem lists have been updated.  Please see the dictated discharge summary for details.   Clinical Lists Changes  Medications: Changed medication from SIMVASTATIN 40 MG TABS (SIMVASTATIN) Take 1 tablet by mouth once a day to SIMVASTATIN 20 MG TABS (SIMVASTATIN) Take 1 tab by mouth at bedtime - Signed Removed medication of DARVOCET A500 100-500 MG TABS (PROPOXYPHENE N-APAP) Take 1 tablet by mouth four times a day - Signed Rx of SIMVASTATIN 20 MG TABS (SIMVASTATIN) Take 1 tab by mouth at bedtime;  #30 x 6;  Signed;  Entered by: Joaquin Courts  MD;  Authorized by: Joaquin Courts  MD;  Method used: Electronically to [facility] [address] [address], [place], [place]  [id number], Ph: [phone], Fax: [phone] Rx of NITROGLYCERIN 0.4 MG SUBL (NITROGLYCERIN) as needed;  #24 x 0;  Signed;  Entered by: Joaquin Courts  MD;  Authorized by: Joaquin Courts  MD;  Method used: Electronically to [facility] [address] [address], [place], [place]  [id number], Ph: [phone], Fax: [phone] Observations: Added new observation of INSTRUCTIONS: Please followup with the outpatient clinic in the next two weeks. Please see your cardiologist as an outpatient. Please seek medical assistance if you have any recurrent chest pain.  Please decrease the dose of your cholesterol pill to 20 mg at bedtime.  (12/03/2009 10:07)    Prescriptions: NITROGLYCERIN 0.4 MG SUBL (NITROGLYCERIN) as needed  #24 x 0   Entered and Authorized  by:   Joaquin Courts  MD   Signed by:   Joaquin Courts  MD on 12/03/2009   Method used:   Electronically to        [facility] [address] (retail)       [address]       [place], [place]  [id number]       Ph: [phone]       Fax: [phone]   RxID:   [id number] SIMVASTATIN 20 MG TABS (SIMVASTATIN) Take 1 tab by mouth at bedtime  #30 x 6   Entered and Authorized by:   Joaquin Courts  MD   Signed by:   Joaquin Courts  MD on 12/03/2009   Method used:   Electronically to        [facility] [facility] [address] (retail)       [address]       [place], [place]  [id number]       Ph: [phone]       Fax: [phone]   RxID:   [id number]    Complete Medication List: 1)  Metoprolol Tartrate 50 Mg Tabs (Metoprolol tartrate) .... Take 1 tablet by mouth two times a day 2)  Simvastatin 20 Mg Tabs (Simvastatin) .... Take 1 tab by mouth at bedtime 3)  Nitroglycerin 0.4 Mg Subl (Nitroglycerin) .... As needed 4)  Plavix 75 Mg Tabs (Clopidogrel bisulfate) .... Take 1 tablet by mouth once a day 5)  Anacin 81 Mg Tbec (Aspirin) .... Take 1 tablet by mouth once  a day 6)  Cozaar 50 Mg Tabs (Losartan potassium) .... Take 1 tablet by mouth once a day 7)  Ambien 5 Mg Tabs (Zolpidem tartrate) .... Bedtime.   Patient Instructions: 1)  Please followup with the outpatient clinic in the next two weeks. 2)  Please see your cardiologist as an outpatient. 3)  Please seek medical assistance if you have any recurrent chest pain.  4)  Please decrease the dose of your cholesterol pill to 20 mg at bedtime.

## 2010-10-16 NOTE — Progress Notes (Signed)
Summary: requests pain med/ hla  Phone Note Call from Patient   Summary of Call: pt calls requesting pain meds for low back pain and bil leg pain. she was disch from hosp 3/21, states she has had this pain "forever". appt given w/ pcp today. Initial call taken by: Marin Roberts RN,  December 06, 2009 8:54 AM

## 2010-10-16 NOTE — Initial Assessments (Signed)
INTERNAL MEDICINE ADMISSION HISTORY AND PHYSICAL  Attending: Dr Aquilla Hacker First contact: Dr Loistine Chance [phone] Second contact: Dr Aldine Contes ([phone]) [phone]  PCP:Dr Ardean [phone] [address], [place] [phone])  HY:QMVHQI,ONGEXBMW and Diarrhea  HPI:  This is  50 year old female with multiple PMH including CAD s/p Acute MI in 2009 s/p stenting to prox. LAD, HTN, Discoid Lupus who presented with chief complain of nausea and vomiting and diarrhea.The history was provided by the patient. On Thursday morning after waking up she suddenly felt nauseated and started to vomit multiple times since then. Emesis were non-bloody and non-bile. no aggravating or alleviating factors could be noted. She was not able to keep up any food. She further complains about multiple episodes of loose non-bloody stool since then. She started to have diffuse epigastric pain and chest pain after multiple episode of emesis. Epigastric pain is characterized as sharp and burning, 8/10 in severity and aggravated with vomiting. Chest pain is  substernal, more a burning sensation, non- radiating, not associated with SOB, palpitation or dizziness. She notices chills but did not take her temperature. She mentioned that she started to take Ampicillin since th beginning of this month for tooth infection ( she notes that she is not taking it regularly). She is scheduled for oral surgery next week. She denies recent diet changes or travel.She denies exposure to anyone with similar symptoms. She denies any dysuria, increased frequency of urination or vaginal discharge. The patient had similar episodes in the past but has not been evaluated since 02/2008 for this complain.   ALLERGIES: Allergies: ! LISINOPRIL (LISINOPRIL)  PAST MEDICAL HISTORY: Past Medical History:  1. 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 Per cath report 2009: Anterior apical akinesis with reduced global EF with 30% 2. Hypertension 3. 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 Osteopenia, T score -1.3 by DXA 8/05 6. Low back pain 7. Menopausal syndrome 8. Myopathy 9. 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 Nocturnal diarrhea 13. Liver lesion seen on Ultrasound 02/2008, followed by and MRI in06/2009  14. Tobacco abuse 15. h/o Alcohol abuse 16. Right complex ovarian cyst  MEDICATIONS: Current Meds:  METOPROLOL TARTRATE 50 MG TABS (METOPROLOL TARTRATE) Take 1 tablet by mouth two times a day SIMVASTATIN 20 MG TABS (SIMVASTATIN) Take 1 tab by mouth at bedtime NITROGLYCERIN 0.4 MG SUBL (NITROGLYCERIN) as needed PLAVIX 75 MG TABS (CLOPIDOGREL BISULFATE) Take 1 tablet by mouth once a day ANACIN 81 MG TBEC (ASPIRIN) Take 1 tablet by mouth once a day COZAAR 50 MG TABS (LOSARTAN POTASSIUM) Take 1 tablet by mouth once a day AMBIEN 5 MG TABS (ZOLPIDEM TARTRATE) bedtime. DICLOFENAC SODIUM ORAL 75 mg once a day VICODIN  as needed    SOCIAL HISTORY: Social History: Smokes half to one pack per day, for more than 10 years Max drinks per day: 1, denies illicit drug use  Lives in [place], is single has to grown up children. 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   FAMILY HISTORY Family History: Mother passed away @ 4. Ca Stomach Brother had brain ca passed away 2 years  ago Sister had cancer in the throat and had breast cancer. Alive.    ROS: has per HPI VITALS: T: 99.0 P: 53 BP:164/84  R:20  O2SAT:100 %  ON:RA PHYSICAL EXAM: Gen: Patient was uncomfortable appearing and had one episode of clear emesis    Eyes: PERRL, EOMI, No signs of anemia or jaundice. ENT: mucous membrane dry,  No erythema, thrush or exudates. Neck: Supple, No carotid Bruits, No JVD, No thyromegaly Resp: CTA- Bilaterally, No W/C/R. CVS: S1S2 RRR, No M/R/G GI: Abdomen is soft. non distended but diffuse tenderness.  BS+ Ext: No pedal edema, cyanosis or clubbing. GU: No CVA tenderness. Skin: No visible rashes, scars. Lymph: No palpable lymphadenopathy. MS: Moving all 4 extremities. Neuro: A&O X3, CN II - XII are grossly intact. Motor strength is 5/5 in the all 4 extremities, Sensations intact to light touch.  Psych: Appropriate  LABS:   WBC                                      16.7       h      4.0-10.5         K/uL  RBC                                      3.97              3.87-5.11        MIL/uL  Hemoglobin (HGB)                         12.7              12.0-15.0        g/dL  Hematocrit (HCT)                         36.1              36.0-46.0        %  MCV                                      90.9              78.0-100.0       fL  MCH -                                    32.0              26.0-34.0        pg  MCHC                                     35.2              30.0-36.0        g/dL  RDW                                      13.2  11.5-15.5        %  Platelet Count (PLT)                     290               150-400          K/uL  Neutrophils, %                           88         h      43-77            %  Lymphocytes, %                           9          l      12-46            %  Monocytes, %                             4                 3-12             %  Eosinophils, %                           0                 0-5              %  Basophils, %                             0                 0-1              %  Neutrophils, Absolute                    14.6       h      1.7-7.7          K/uL  Lymphocytes, Absolute                    1.4               0.7-4.0          K/uL  Monocytes,  Absolute                      0.7               0.1-1.0          K/uL  Eosinophils, Absolute                    0.0               0.0-0.7          K/uL  Basophils, Absolute                      0.0               0.0-0.1  K/uL   Sodium (NA)                              139               135-145          mEq/L  Potassium (K)                            3.1        l      3.5-5.1          mEq/L  Chloride                                 109               96-112           mEq/L  CO2                                      24                19-32            mEq/L  Glucose                                  132        h      70-99            mg/dL  BUN                                      11                6-23             mg/dL  Creatinine                               0.72              0.4-1.2          mg/dL  GFR, Est Non African American            >60               >60              mL/min  GFR, Est African American                >60               >60              mL/min    Oversized comment, see footnote  1  Bilirubin, Total                         0.4               0.3-1.2          mg/dL  Alkaline Phosphatase  73                39-117           U/L  SGOT (AST)                               41         h      0-37             U/L  SGPT (ALT)                               37         h      0-35             U/L  Total  Protein                           8.0               6.0-8.3          g/dL  Albumin-Blood                            4.0               3.5-5.2          g/dL  Calcium                                  8.6               8.4-10.5         mg/dL   Lipase                                   20    Color, Urine                             YELLOW            YELLOW  Appearance                               HAZY       a      CLEAR  Specific Gravity                         1.014             1.005-1.030  pH                                       7.0               5.0-8.0   Urine Glucose                            NEGATIVE          NEG  mg/dL  Bilirubin                                NEGATIVE          NEG  Ketones                                  NEGATIVE          NEG              mg/dL  Blood                                    NEGATIVE          NEG  Protein                                  NEGATIVE          NEG              mg/dL  Urobilinogen                             0.2               0.0-1.0          mg/dL  Nitrite                                  NEGATIVE          NEG  Leukocytes                               NEGATIVE          NEG  CT abdom/pelvis: Findings: Lung bases are clear.  No effusions.  Heart is normal   size.    Liver, spleen, gallbladder, pancreas, adrenals and kidneys are   unremarkable.  Atherosclerotic disease in the aorta without   aneurysm.    Small cyst or follicle noted in the right ovary.  Uterus and left   ovary unremarkable. Bowel grossly unremarkable.  No free fluid,   free air, or adenopathy. Appendix is visualized and is normal.    IMPRESSION:   No acute findings in the abdomen or pelvis.      ASSESSMENT AND PLAN: (1) Nausea, vomiting and diarrhea: This is most likely due to viral gastroenteritis. The patient was admitted  and  was made NPO, was given IV fluids, morphine, zofran, protonix drip. Stool studies and C.diff toxin was ordered. Furthermore UDS and blood culture . Other differential include UTI less likely with a normal UA, pancreatitis on the base of h/o alcohol abuse in the past but again less likely due to no acute process on CT. This could be also be narcotic bowel syndrome that is characterized by chronic or frequently recurring abdominal pain that worsens with continued or escalating dosages of narcotics. Patient received narcotics on a regular basis for her back pain and fibromyalgia.  2. Hypokalemia : most likely due to emesis with metabolic alkalosis causing loss of potassium in the urine. Check  Mg. Repleted  with KCl. 3. Elevated AST/ALT: Episodes of elevated AST/ALT's in the past but with normalization of the level. Differential include Simvastatin, alcohol or hepatitis (less likely). Hepatitis panel ordered and will monitor. Ultrasound in 05/2008 :The liver demonstrates two small hypoechoic solid nodules one measuring 15 mm and second measuring 17 mm.  These appear stable compared to 02/23/2008.  No additional lesions visualized. No biliary dilatation.  No interval enlargement of the nodules.  No free fluid.  Gallbladder is normal. MRI abdomen in 02/2008  IMPRESSION:  Two lesions in the lateral segment left hepatic lobe are faintly   T2 hyperintense and demonstrate arterial phase enhancement and   faint persistent enhancement.  Differential diagnostic   considerations include small atypical hemangiomas (atypical due to   their minimal T2 hyperintensity and on characteristic hypoechoic   appearance on sonography), dysplastic liver nodules, small foci of   focal nodular hyperplasia.  Hepatic cellular carcinoma or   hypervascular metastatic disease are both considered less likely,   but these may lesions likely merit observation.  Given their   excellent conspicuity by ultrasound, sonographic follow-up is   recommended in 3-6 months time.  Alternatively, sonographic biopsy   may be feasible. 2 small stable hypoechoic liver nodules compared to 02/23/2008. CT with contrast on 05/05/10 liver was unremarkable.  4. GERD started on protonix drip 5. Hyperlipidemia: holding statin 6. Tobacco abuse:  Counseling given, patient wants to quit by herself. Consult CSW for cessation. Nicotine patches.  7. discoid Lupus: diagnosed in 1997with ANA, rash, Anti SSA: had been followed up by Dr Beryle Beams, Theodis Sato, currently not followed by any Rheumatologist, off prednisone for a while, no acute issues 7. Back pain: present currently taking Vicodin at home. Evaluation was done in the past with   DG  Thoracic Spine(08/2008) Findings: No compression fractures or acute changes.  Pedicles   intact.  Mild biconcave scoliosis.  Paraspinous soft tissues normal IMPRESSION:   No acute findings.  Lumbar Spine 07/2008) Findings: There are diminutive twelfth ribs.  There are five non-   rib bearing type vertebral bodies.  There is disc space narrowing   with endplate osteophytes throughout the region.  No advanced disc   space narrowing.  There are mild lower lumbar facet degenerative   changes.  No slippage.  No fracture or focal lesion. IMPRESSION:   Lumbar degenerative disc disease and degenerative facet disease as   described  8. DVT Prophylaxis: Lovenox

## 2010-10-16 NOTE — Progress Notes (Signed)
Summary: Refill/gh  Phone Note Refill Request Message from:  Pharmacy on March 05, 2010 9:22 AM  Refills Requested: Medication #1:  COZAAR 50 MG TABS Take 1 tablet by mouth once a day Hospital discharge on 3.22/2011.   Method Requested: Electronic Initial call taken by: Angelina Ok RN,  March 05, 2010 9:22 AM  Follow-up for Phone Call        Pt D/C'd March 2011.  Didn't keep Hospital F/U appt and cancelled 3 other appts.  Will refill med 2 months.  Sent flag to Ms Lissa Hoard  to make appt in next 4 weeks.   Follow-up by: Blanch Media MD,  March 05, 2010 9:34 AM    Prescriptions: COZAAR 50 MG TABS (LOSARTAN POTASSIUM) Take 1 tablet by mouth once a day  #31 x 1   Entered and Authorized by:   Blanch Media MD   Signed by:   Blanch Media MD on 03/05/2010   Method used:   Electronically to        [facility] (retail)       [address]       [place], [place]  [id number]       Ph: [phone]       Fax: [phone]   RxID:   [id number]

## 2010-10-16 NOTE — Discharge Summary (Signed)
Summary: Hospital Discharge Update    Hospital Discharge Update:  Date of Admission: 08/09/2010 Date of Discharge: 08/10/2010  Brief Summary:  [name] is a 50 year old woman with significant past medical history for CAD, Discoid Lupus, GERD that presented with nausea and vomiting. She also complained of weight loss of approximately 20-25 pounds in the last year, dysphagia to solids for the last 6 months and night sweats also for 6 months. She has been admited other times for nausea and vomiting. 1. Nausea, vomiting. The nausea and vomiting have completely resolved. We are thinking that this symptoms plus the dysphagia and weight loss might  be due to some kind of malignancy, like esophageal cancer, or maybe a flare of Systemic Lupus.  We did a Barium Swallow, CT scan of Chest, Abdomen and Pelvis, to rule out a malignancy. Also ordered ANA. Awaiting results of above. 2. Dysphagia and Weight loss. Concerning for malignancy (see problem number 1) 3. Discoid Lupus. No rash. Not currently on any medications. 4. History of MI. Currently stable 5. GERD. Started PPI 6. Dyslipidemia. Continue Simvastatin 7. Myopathy 8. Fibromyalgia 9. Small right breast (congenital) 10. Lipodystrophy 11. Low Back pain    Labs needed at follow-up: CBC with differential, Basic metabolic panel  Other labs needed at follow-up: ANA, CT scan chest, abdomen and pelvis  Other follow-up issues:  Follow up on Dysphagia, weight loss.  Medication list changes:  Added new medication of NEXIUM 40 MG PACK (ESOMEPRAZOLE MAGNESIUM) Take one tablet a day once daily. - Signed Rx of NEXIUM 40 MG PACK (ESOMEPRAZOLE MAGNESIUM) Take one tablet a day once daily.;  #30 x 3;  Signed;  Entered by: Vassie Loll MD;  Authorized by: Vassie Loll MD;  Method used: Electronically to [facility] [address] [address], [place], [place]  [id number], Ph: [phone], Fax: [phone]  The medication, problem, and  allergy lists have been updated.  Please see the dictated discharge summary for details.  Discharge medications:  METOPROLOL TARTRATE 50 MG TABS (METOPROLOL TARTRATE) Take 1 tablet by mouth two times a day SIMVASTATIN 20 MG TABS (SIMVASTATIN) Take 1 tab by mouth at bedtime NITROGLYCERIN 0.4 MG SUBL (NITROGLYCERIN) as needed PLAVIX 75 MG TABS (CLOPIDOGREL BISULFATE) Take 1 tablet by mouth once a day ANACIN 81 MG TBEC (ASPIRIN) Take 1 tablet by mouth once a day COZAAR 50 MG TABS (LOSARTAN POTASSIUM) Take 1 tablet by mouth once a day AMBIEN 5 MG TABS (ZOLPIDEM TARTRATE) bedtime. NEXIUM 40 MG PACK (ESOMEPRAZOLE MAGNESIUM) Take one tablet a day once daily.  Other patient instructions:  Please call clinic at [phone] for a follow up appointment within two weeks. Take your medications as prescribed and call if you have any questions.   Note: Hospital Discharge Medications & Other Instructions handout was printed, one copy for patient and a second copy to be placed in hospital chart.  Prescriptions: NEXIUM 40 MG PACK (ESOMEPRAZOLE MAGNESIUM) Take one tablet a day once daily.  #30 x 3   Entered and Authorized by:   Vassie Loll MD   Signed by:   Vassie Loll MD on 08/10/2010   Method used:   Electronically to        [facility] (retail)       [address]       [place], [place]  [id number]       Ph: [phone]       Fax: [phone]   RxID:   [id number]

## 2010-10-16 NOTE — Miscellaneous (Signed)
Summary: Hospital Admission  INTERNAL MEDICINE ADMISSION HISTORY AND PHYSICAL  First Contact: Dr. Scot Dock [phone]) Second Contact: Dr. Cena Benton [phone])  Weekdays, Holidays, or after 5pm weekdays:  First Contact: [phone] Second [phone]  PCP: Brooks Sailors, MD  CC: Chest Pain  HPI:   50 year old caucasian lady with PMH as mentioned below including CAD s/p Acute MI in 2009 s/p stenting to proximal LAD, HTN, ongoing tobacco abuse is brought by EMS secondary to chest pain. Patient reports substernal/epigastric chest pain that started around 12:30 PM today, 10/10, non-radiating, associated with nausea and vomiting X1, frontal headaches, some dizziness, SOB, sweating, lasted until she got to the ED. Patients son called EMS and was given ASA, Nitro which didnt help her. Patient received another nitro in the ED which subsequently received her symptoms. Patient reports similar episode several months ago which resolved by itself. She denies any cough, fever, diarrhea, constipation, palpitations, dizziness, headaches but reports that she has lot of stress at home (mostly financial).  She reports that she feels SOB after walking blocks but denies any chest pain. Patient saw Dr. Donnie Aho 8 months ago and reports that she stopped taking her Metoprolol several months. Patient reports compliance to her ASA, Plavix, statins.  ALLERGIES:  ! LISINOPRIL  PAST MEDICAL HISTORY:  CAD, s/p Acute inferior infarct in October 2009, resulted into V-fib & arrest, revived with defib, emergency cath showed 90% stenosis proximal LAD, S/P DES of the proximal LAD. Per cath report 2009 : Anterior apical akinesis with reduced global ejection fraction with EF 30%. 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 Liver lesion, left lobe, awaiting follow up June 09 Right complex ovarian cyst, awaiting Gyn work/up June 09.    MEDICATIONS:   METOPROLOL TARTRATE 50 MG TABS (METOPROLOL TARTRATE) Take 1 tablet by mouth two times a day - Currently not taking SIMVASTATIN 40 MG TABS (SIMVASTATIN) Take 1 tablet by mouth once a day NITROGLYCERIN 0.4 MG SUBL (NITROGLYCERIN) as needed, hasn't used since 2009. PLAVIX 75 MG TABS (CLOPIDOGREL BISULFATE) Take 1 tablet by mouth once a day ANACIN 81 MG TBEC (ASPIRIN) Take 1 tablet by mouth once a day COZAAR 50 MG TABS (LOSARTAN POTASSIUM) Take 1 tablet by mouth once a day   SOCIAL HISTORY:  Lives in [place], has to grown up kids Smoking cigarettes 1PPD for many years. Drinks 6-12 beers on week ends. Has Medicare/ Medicaid Disabled secondary to lupus, fibromylagia  FAMILY HISTORY  Mother passed away @ 95 2/2 Stomach cancer Dad- Medical history not known Brother had brain ca   ROS: All systems reviewed and negative except per HPI.  VITALS:  T: 98.3  P: 83  BP: 125/70  R: 16  O2SAT: 100 on RA  PHYSICAL EXAM:  Gen: Patient is in NAD, Pleasant, resting comfortably on the bed Eyes: PERRL, EOMI, No signs of anemia or jaundince. ENT: MMM, OP clear, No erythema, thrush or exudates. Neck: Supple, No carotid Bruits, No JVD, No thyromegaly Resp: Mild TTP over the mid-chest. CTA- Bilaterally, No W/C/R. CVS: S1S2 RRR, No M/R/G GI: Abdomen is soft. ND, NT, NG, NR, BS+. No organomegaly.  Ext: No pedal edema, cyanosis or clubbing. GU: No CVA tenderness. Skin: No visible rashes, scars.  Lymph: No palpable lymphadenopathy. MS: Moving all 4 extremities. Neuro: A&O X3, CN II - XII are grossly intact. Motor strength is 5/5 in the all 4 extremities, Sensations intact to light touch, Gait normal, Cerebellar signs negative. Psych:  Appropriate  LABS:  CBC   WBC                                      10.3              4.0-10.5         K/uL  RBC                                      3.85       l      3.87-5.11        MIL/uL  Hemoglobin (HGB)                         12.3              12.0-15.0        g/dL  Hematocrit (HCT)                         36.5              36.0-46.0        %  MCV                                      94.8              78.0-100.0       fL  MCHC                                     33.8              30.0-36.0        g/dL  RDW                                      13.8              11.5-15.5        %  Platelet Count (PLT)                     325               150-400          K/uL  BMP   Sodium (NA)                              143               135-145          mEq/L  Potassium (K)                            3.3  l      3.5-5.1          mEq/L  Chloride                                 110               96-112           mEq/L  CO2                                      18         l      19-32            mEq/L  Glucose                                  82                70-99            mg/dL  BUN                                      14                6-23             mg/dL  Creatinine                               0.67              0.4-1.2          mg/dL  GFR, Est Non African American            >60               >60              mL/min  GFR, Est African American                >60               >60              mL/min  Calcium                                  9.1               8.4-10.5         mg/dL  D-Dimer                                 <0.22             0.00-0.48        ug/mL-FEU  POC    CKMB                                     <1.0       l  1.0-8.0          ng/mL  Troponin I, POC                          <0.05             0.00-0.09        ng/mL  Myoglobin, POC                           77.1              12-200           ng/mL  Chest Xray Stable chest x-ray with hyperaeration.   No  active lung disease.  Cath report October 2009  Mild calcification noted in the proximal LAD and circumflex.   The left main coronary artery is normal.   Left anterior descending has calcification proximally.  There is a segmental 80-90% stenosis after a septal   perforator branching before a diagonal branch.  The vessel extends around the apex.  The circumflex coronary artery:  Normal.  Right coronary artery:  Dominant vessel and contains no significant stenosis.  ASSESSMENT AND PLAN:  1. CHEST PAIN:  Differentials include ACS  vs GERD. PE ruled out with a negative d-dimer. Given her history of CAD s/p stenting and ongoing tobacco abuse, Medication non-compliance (Metoprolol), and recent stress, ACS is very high on the differential and needs to be ruled out. ECG is negative for acute ST/T wave changes suggestive of ACS and POC are negative.  Plans:  Admit to Telemetry Cycle CE's, if negative she would need a stress test, if positive would benefit from Cardiac catheterization. Consider Cardiology consultation in AM. Repeat ECG now and in AM Continue full dose ASA, plavix Restart B-blockers. Consider Heparin anticoagulation if CE +. Nitroglycerin as needed. Cont her ACE-I, Statins. Check FLP, HbA1C. Check 2D-EChocardiogram  2. HTN:   Restart B-blocker Cont cozaar  3. HYPERLIPIDEMIA:  Cont statins Check FLP  4. TOBACCO ABUSE:  Counselling given, patient wants to quit by herself. Consult CSW for cessation. Nicotine patches.  5. GERD:  Protonix  6. HYPOKALEMIA:  Replete Check Mg.  7. VTE PPX:  Lovenox   I performed and/or observed a history and physical examination of the patient.  I discussed the case with the residents as noted and reviewed the residents' notes.  I agree with the findings and plan--please refer to the attending physician note for more details.   Signature   Printed Name

## 2010-11-27 LAB — URINALYSIS, ROUTINE W REFLEX MICROSCOPIC
Bilirubin Urine: NEGATIVE
Glucose, UA: NEGATIVE mg/dL
Ketones, ur: NEGATIVE mg/dL
Leukocytes, UA: NEGATIVE
Nitrite: NEGATIVE
Protein, ur: 100 mg/dL — AB
Specific Gravity, Urine: 1.028 (ref 1.005–1.030)
Urobilinogen, UA: 0.2 mg/dL (ref 0.0–1.0)
pH: 6 (ref 5.0–8.0)

## 2010-11-27 LAB — COMPREHENSIVE METABOLIC PANEL
Albumin: 5 g/dL (ref 3.5–5.2)
Alkaline Phosphatase: 64 U/L (ref 39–117)
BUN: 20 mg/dL (ref 6–23)
Chloride: 106 mEq/L (ref 96–112)
Creatinine, Ser: 0.77 mg/dL (ref 0.4–1.2)
Glucose, Bld: 141 mg/dL — ABNORMAL HIGH (ref 70–99)
Potassium: 4.2 mEq/L (ref 3.5–5.1)
Total Bilirubin: 0.7 mg/dL (ref 0.3–1.2)
Total Protein: 9.4 g/dL — ABNORMAL HIGH (ref 6.0–8.3)

## 2010-11-27 LAB — DIFFERENTIAL
Basophils Absolute: 0 10*3/uL (ref 0.0–0.1)
Basophils Relative: 0 % (ref 0–1)
Eosinophils Absolute: 0 K/uL (ref 0.0–0.7)
Eosinophils Relative: 0 % (ref 0–5)
Lymphocytes Relative: 7 % — ABNORMAL LOW (ref 12–46)
Lymphs Abs: 0.9 K/uL (ref 0.7–4.0)
Monocytes Absolute: 0.2 K/uL (ref 0.1–1.0)
Monocytes Relative: 2 % — ABNORMAL LOW (ref 3–12)
Neutro Abs: 11.3 10*3/uL — ABNORMAL HIGH (ref 1.7–7.7)
Neutrophils Relative %: 91 % — ABNORMAL HIGH (ref 43–77)

## 2010-11-27 LAB — COMPREHENSIVE METABOLIC PANEL WITH GFR
ALT: 32 U/L (ref 0–35)
AST: 36 U/L (ref 0–37)
CO2: 22 meq/L (ref 19–32)
Calcium: 10.1 mg/dL (ref 8.4–10.5)
GFR calc Af Amer: >60 mL/min (ref >60)
GFR calc non Af Amer: >60 mL/min (ref >60)
Sodium: 145 meq/L (ref 135–145)

## 2010-11-27 LAB — URINE MICROSCOPIC-ADD ON

## 2010-11-27 LAB — CULTURE, BLOOD (ROUTINE X 2)
Culture  Setup Time: 201111241935
Culture: NO GROWTH

## 2010-11-27 LAB — CBC
HCT: 33.5 % — ABNORMAL LOW (ref 36.0–46.0)
HCT: 39.8 % (ref 36.0–46.0)
Hemoglobin: 11.3 g/dL — ABNORMAL LOW (ref 12.0–15.0)
Hemoglobin: 13.9 g/dL (ref 12.0–15.0)
MCH: 30.7 pg (ref 26.0–34.0)
MCH: 32.4 pg (ref 26.0–34.0)
MCHC: 33.7 g/dL (ref 30.0–36.0)
MCHC: 35 g/dL (ref 30.0–36.0)
MCV: 91 fL (ref 78.0–100.0)
MCV: 92.5 fL (ref 78.0–100.0)
Platelets: 346 10*3/uL (ref 150–400)
RBC: 3.68 MIL/uL — ABNORMAL LOW (ref 3.87–5.11)
RBC: 4.31 MIL/uL (ref 3.87–5.11)
RDW: 14.5 % (ref 11.5–15.5)
WBC: 12.4 10*3/uL — ABNORMAL HIGH (ref 4.0–10.5)

## 2010-11-27 LAB — ANTI-NUCLEAR AB-TITER (ANA TITER): ANA Titer 1: 1:640 {titer} — ABNORMAL HIGH

## 2010-11-27 LAB — CARDIAC PANEL(CRET KIN+CKTOT+MB+TROPI)
CK, MB: 1.2 ng/mL (ref 0.3–4.0)
Relative Index: INVALID (ref 0.0–2.5)
Total CK: 70 U/L (ref 7–177)
Troponin I: 0.04 ng/mL (ref 0.00–0.06)

## 2010-11-27 LAB — POCT PREGNANCY, URINE: Preg Test, Ur: NEGATIVE

## 2010-11-27 LAB — LIPASE, BLOOD: Lipase: 49 U/L (ref 11–59)

## 2010-11-27 LAB — TSH: TSH: 0.374 u[IU]/mL (ref 0.350–4.500)

## 2010-11-27 LAB — CK TOTAL AND CKMB (NOT AT ARMC)
CK, MB: 1.2 ng/mL (ref 0.3–4.0)
Relative Index: INVALID (ref 0.0–2.5)

## 2010-11-29 LAB — CBC
HCT: 32.4 % — ABNORMAL LOW (ref 36.0–46.0)
HCT: 36.1 % (ref 36.0–46.0)
Hemoglobin: 10.4 g/dL — ABNORMAL LOW (ref 12.0–15.0)
Hemoglobin: 11.2 g/dL — ABNORMAL LOW (ref 12.0–15.0)
Hemoglobin: 12.7 g/dL (ref 12.0–15.0)
MCH: 31 pg (ref 26.0–34.0)
MCH: 31.9 pg (ref 26.0–34.0)
MCH: 32 pg (ref 26.0–34.0)
MCHC: 34 g/dL (ref 30.0–36.0)
MCHC: 34.6 g/dL (ref 30.0–36.0)
MCV: 90.9 fL (ref 78.0–100.0)
MCV: 91.3 fL (ref 78.0–100.0)
MCV: 92.3 fL (ref 78.0–100.0)
RBC: 3.51 MIL/uL — ABNORMAL LOW (ref 3.87–5.11)
RBC: 3.97 MIL/uL (ref 3.87–5.11)

## 2010-11-29 LAB — STOOL CULTURE

## 2010-11-29 LAB — DIFFERENTIAL
Basophils Absolute: 0 10*3/uL (ref 0.0–0.1)
Eosinophils Relative: 0 % (ref 0–5)
Lymphocytes Relative: 9 % — ABNORMAL LOW (ref 12–46)
Neutrophils Relative %: 88 % — ABNORMAL HIGH (ref 43–77)

## 2010-11-29 LAB — BASIC METABOLIC PANEL
BUN: 9 mg/dL (ref 6–23)
CO2: 22 mEq/L (ref 19–32)
CO2: 24 mEq/L (ref 19–32)
CO2: 24 mEq/L (ref 19–32)
CO2: 25 mEq/L (ref 19–32)
Calcium: 8 mg/dL — ABNORMAL LOW (ref 8.4–10.5)
Calcium: 8.7 mg/dL (ref 8.4–10.5)
Chloride: 108 mEq/L (ref 96–112)
Chloride: 109 mEq/L (ref 96–112)
Chloride: 111 mEq/L (ref 96–112)
Chloride: 111 mEq/L (ref 96–112)
Creatinine, Ser: 0.64 mg/dL (ref 0.4–1.2)
Creatinine, Ser: 0.66 mg/dL (ref 0.4–1.2)
Creatinine, Ser: 1.03 mg/dL (ref 0.4–1.2)
GFR calc Af Amer: >60 mL/min (ref >60)
GFR calc Af Amer: >60 mL/min (ref >60)
Glucose, Bld: 89 mg/dL (ref 70–99)
Glucose, Bld: 94 mg/dL (ref 70–99)
Potassium: 3.3 mEq/L — ABNORMAL LOW (ref 3.5–5.1)
Potassium: 3.9 mEq/L (ref 3.5–5.1)
Potassium: 4.4 mEq/L (ref 3.5–5.1)
Sodium: 140 mEq/L (ref 135–145)
Sodium: 141 mEq/L (ref 135–145)

## 2010-11-29 LAB — CK TOTAL AND CKMB (NOT AT ARMC)
CK, MB: 1.1 ng/mL (ref 0.3–4.0)
Total CK: 77 U/L (ref 7–177)

## 2010-11-29 LAB — COMPREHENSIVE METABOLIC PANEL
AST: 41 U/L — ABNORMAL HIGH (ref 0–37)
BUN: 11 mg/dL (ref 6–23)
CO2: 24 mEq/L (ref 19–32)
Chloride: 109 mEq/L (ref 96–112)
Creatinine, Ser: 0.72 mg/dL (ref 0.4–1.2)
GFR calc Af Amer: >60 mL/min (ref >60)
GFR calc non Af Amer: >60 mL/min (ref >60)
Glucose, Bld: 132 mg/dL — ABNORMAL HIGH (ref 70–99)
Total Bilirubin: 0.4 mg/dL (ref 0.3–1.2)

## 2010-11-29 LAB — LIPASE, BLOOD: Lipase: 20 U/L (ref 11–59)

## 2010-11-29 LAB — URINALYSIS, ROUTINE W REFLEX MICROSCOPIC
Bilirubin Urine: NEGATIVE
Ketones, ur: NEGATIVE mg/dL
Nitrite: NEGATIVE
Specific Gravity, Urine: 1.014 (ref 1.005–1.030)
Urobilinogen, UA: 0.2 mg/dL (ref 0.0–1.0)

## 2010-11-29 LAB — CARDIAC PANEL(CRET KIN+CKTOT+MB+TROPI)
CK, MB: 1.1 ng/mL (ref 0.3–4.0)
Relative Index: INVALID (ref 0.0–2.5)
Troponin I: 0.01 ng/mL (ref 0.00–0.06)
Troponin I: 0.04 ng/mL (ref 0.00–0.06)

## 2010-11-29 LAB — CLOSTRIDIUM DIFFICILE EIA: C difficile Toxins A+B, EIA: NEGATIVE

## 2010-11-29 LAB — HEPATITIS PANEL, ACUTE: Hep A IgM: NEGATIVE

## 2010-11-29 LAB — RAPID URINE DRUG SCREEN, HOSP PERFORMED
Amphetamines: NOT DETECTED
Tetrahydrocannabinol: POSITIVE — AB

## 2010-11-29 LAB — CULTURE, BLOOD (ROUTINE X 2): Culture: NO GROWTH

## 2010-11-29 LAB — GIARDIA/CRYPTOSPORIDIUM SCREEN(EIA): Cryptosporidium Screen (EIA): NEGATIVE

## 2010-12-07 LAB — LIPID PANEL
Cholesterol: 76 mg/dL (ref 0–200)
HDL: 45 mg/dL (ref >39)
LDL Cholesterol: 19 mg/dL (ref 0–99)
Total CHOL/HDL Ratio: 1.7 RATIO
Triglycerides: 61 mg/dL (ref <150)

## 2010-12-07 LAB — LIPASE, BLOOD: Lipase: 19 U/L (ref 11–59)

## 2010-12-07 LAB — BASIC METABOLIC PANEL
CO2: 18 mEq/L — ABNORMAL LOW (ref 19–32)
CO2: 22 mEq/L (ref 19–32)
Calcium: 9.1 mg/dL (ref 8.4–10.5)
Chloride: 103 mEq/L (ref 96–112)
Chloride: 110 mEq/L (ref 96–112)
Creatinine, Ser: 0.66 mg/dL (ref 0.4–1.2)
Creatinine, Ser: 0.67 mg/dL (ref 0.4–1.2)
GFR calc Af Amer: >60 mL/min (ref >60)
Glucose, Bld: 82 mg/dL (ref 70–99)
Potassium: 4.4 mEq/L (ref 3.5–5.1)

## 2010-12-07 LAB — POCT CARDIAC MARKERS
CKMB, poc: <1 ng/mL — ABNORMAL LOW (ref 1.0–8.0)
Myoglobin, poc: 97.8 ng/mL (ref 12–200)
Troponin i, poc: <0.05 ng/mL (ref 0.00–0.09)
Troponin i, poc: <0.05 ng/mL (ref 0.00–0.09)

## 2010-12-07 LAB — HEMOGLOBIN A1C
Hgb A1c MFr Bld: 5.8 % (ref 4.6–6.1)
Mean Plasma Glucose: 120 mg/dL

## 2010-12-07 LAB — CBC
MCHC: 33.8 g/dL (ref 30.0–36.0)
MCV: 94.8 fL (ref 78.0–100.0)
MCV: 95.1 fL (ref 78.0–100.0)
Platelets: 300 10*3/uL (ref 150–400)
RBC: 3.43 MIL/uL — ABNORMAL LOW (ref 3.87–5.11)
RBC: 3.85 MIL/uL — ABNORMAL LOW (ref 3.87–5.11)
RDW: 13.8 % (ref 11.5–15.5)
WBC: 7 10*3/uL (ref 4.0–10.5)

## 2010-12-07 LAB — COMPREHENSIVE METABOLIC PANEL
Alkaline Phosphatase: 67 U/L (ref 39–117)
BUN: 13 mg/dL (ref 6–23)
Chloride: 106 mEq/L (ref 96–112)
Creatinine, Ser: 0.73 mg/dL (ref 0.4–1.2)
Glucose, Bld: 104 mg/dL — ABNORMAL HIGH (ref 70–99)
Potassium: 3.3 mEq/L — ABNORMAL LOW (ref 3.5–5.1)
Total Bilirubin: 0.4 mg/dL (ref 0.3–1.2)

## 2010-12-07 LAB — CK TOTAL AND CKMB (NOT AT ARMC)
CK, MB: 1.1 ng/mL (ref 0.3–4.0)
Total CK: 84 U/L (ref 7–177)

## 2010-12-07 LAB — CARDIAC PANEL(CRET KIN+CKTOT+MB+TROPI)
CK, MB: 0.9 ng/mL (ref 0.3–4.0)
CK, MB: 1 ng/mL (ref 0.3–4.0)
Relative Index: INVALID (ref 0.0–2.5)
Total CK: 74 U/L (ref 7–177)
Total CK: 83 U/L (ref 7–177)

## 2010-12-07 LAB — TSH: TSH: 0.731 u[IU]/mL (ref 0.350–4.500)

## 2010-12-07 LAB — D-DIMER, QUANTITATIVE: D-Dimer, Quant: <0.22 ug/mL-FEU (ref 0.00–0.48)

## 2010-12-07 LAB — GLUCOSE, CAPILLARY
Glucose-Capillary: 100 mg/dL — ABNORMAL HIGH (ref 70–99)
Glucose-Capillary: 103 mg/dL — ABNORMAL HIGH (ref 70–99)
Glucose-Capillary: 93 mg/dL (ref 70–99)

## 2010-12-07 LAB — MAGNESIUM: Magnesium: 1.9 mg/dL (ref 1.5–2.5)

## 2010-12-18 ENCOUNTER — Institutional Professional Consult (permissible substitution): Payer: Self-pay | Admitting: Cardiology

## 2011-01-29 NOTE — Cardiovascular Report (Signed)
[name], GRIPPI NO.:  [id number]   MEDICAL RECORD NO.:  [id number]          PATIENT TYPE:  INP   LOCATION:  2911                         FACILITY:  [facility]   PHYSICIAN:  Colleen Can. Deborah Chalk, M.D.DATE OF BIRTH:  05-Sep-1961   DATE OF PROCEDURE:  07/04/2008  DATE OF DISCHARGE:                            CARDIAC CATHETERIZATION   7   PROCEDURE:  Angioplasty and stent placed in the proximal left anterior  descending with followup left ventricular angiogram.   Initial angiograms were performed by Dr. Viann Fish.  Severe  segmental stenosis in the proximal left anterior descending was  identified, which caused to the patient's acute anterior myocardial  infarction.  There was TIMI grade III flow at the time of angioplasty  started.   Initially, we tried a JL-4 guide, but we had difficulty in getting the  guidewire to enter the LAD.  We subsequently change for a JL-3.5 and we  were able to pass the [place] guidewire into the left anterior  descending.  Pre-dilatation was performed with a 2.5 x 20 mm apex  balloon.  This was felt that the appropriate size was a 3.0 stent x 23  mm length.  I chose a Promus stent because the patient was felt to be  compliant, she would be able to take Plavix for a year.   The 3.0 x 23 mm Promus stent was placed in the proximal left anterior  descending, but ended right at the branching of a large diagonal vessel.  The stent was inflated to a maximum of 15 atmospheres.  We subsequently  turned with a 3.5 x 15 mm Quantum Maverick balloon inflated that to  maximum of 7 atmospheres.  Final angiographic result was felt to be  excellent with no residual stenosis.  The initial stenosis was felt to  be 95-99% and still allowed adequate flow at the time of the initiation  of the procedure.   The left ventricular angiogram was performed in the RAO position.  It  was performed using 30 mL of contrast at 12 mL per second.  There was  anterior apical akinesis which gave the appearance of a slipper to the  left ventricular silhouette.  There was no intracavitary masses noted in  the large area of apical akinesia.  There was 1+ mitral regurgitation  was felt it would probably be related to ectopics.  The global ejection  fraction was estimated to be in the 30% range at best.   OVERALL IMPRESSION:  1. Successful stent placement in the proximal left anterior descending      in the setting of acute anterior myocardial infarction.  2. Anterior apical akinesis with reduced global ejection fraction.   ADDENDUM:  The left ventricular end-diastolic pressure was 23 mmHg with  no gradient across the aortic valve.  Lasix 20 mg was given after the  procedure.      Colleen Can. Deborah Chalk, M.D.  Electronically Signed     SNT/MEDQ  D:  07/04/2008  T:  07/05/2008  Job:  [id number]   cc:   Georga Hacking, M.D.  [facility]

## 2011-01-29 NOTE — Cardiovascular Report (Signed)
NAMEJERRIAH, [name] NO.:  [id number]   MEDICAL RECORD NO.:  [id number]          PATIENT TYPE:  INP   LOCATION:  2911                         FACILITY:  [facility]   PHYSICIAN:  Georga Hacking, M.D.DATE OF BIRTH:  [birth year]   DATE OF PROCEDURE:  DATE OF DISCHARGE:                            CARDIAC CATHETERIZATION   HISTORY:  A 50 year old female who presented to the emergency room with  chest pain, starting at 9:00 a.m. and then had a ventricular  fibrillatory arrest on arrival to the ER.  She was shocked and had a  lidocaine bolus and repeat EKG showed an acute anterior infarction.  She  was brought to the catheterization laboratory for intervention.   PROCEDURE:  The patient was brought to the cath lab and prepped and  draped in the usual manner.  After Xylocaine anesthesia, a 6-French  sheath was placed in the right femoral artery percutaneously through a 6-  French sheath.  Catheters used were 6-French left and right catheters.  Dr. Deborah Chalk scrubbed into the case at that point to do the PCI, and at  that point, completed the case with PCI and was going to do an LV gram  at that portion of the test.  She tolerated the procedure well to that  point.   HEMODYNAMIC DATA:  Aorta:  Precontrast was 133/75.   Left ventriculogram was not performed.   The coronary arteries:  Arise and distribute normally.  There was mild  calcification noted in the proximal LAD and circumflex.  The left main  coronary artery is normal.  Left anterior descending has calcification  proximally.  There is a segmental 80-90% stenosis after a septal  perforator branching before a diagonal branch.  The vessel extends  around the apex.   The circumflex coronary artery:  Normal.   Right coronary artery:  Dominant vessel and contains no significant  stenosis.   IMPRESSION:  Acute anterior infarction with severe stenosis involving  the proximal left anterior descending in the setting of  acute anterior  infarction.   RECOMMENDATIONS:  Dr. Deborah Chalk scrubbed into this portion of the case to  complete a percutaneous intervention and possibly percutaneous closure  of the artery and LV gram.      Georga Hacking, M.D.  Electronically Signed    WST/MEDQ  D:  07/04/2008  T:  07/05/2008  Job:  [id number]   cc:   Colleen Can. Deborah Chalk, M.D.  [facility].

## 2011-01-29 NOTE — Discharge Summary (Signed)
NAMELEIMOMI, ZERVAS NO.:  [id number]   MEDICAL RECORD NO.:  [id number]          PATIENT TYPE:  INP   LOCATION:  2023                         FACILITY:  [facility]   PHYSICIAN:  Georga Hacking, M.D.DATE OF BIRTH:  Jun 17, 1961   DATE OF ADMISSION:  07/04/2008  DATE OF DISCHARGE:  07/08/2008                               DISCHARGE SUMMARY   FINAL DIAGNOSES:  1. Acute anterior myocardial infarction, initial episode.      a.     Acute ventricular fibrillation, probably due to reperfusion       arrhythmia.      b.     Minimal elevation of CPK and troponin.      c.     Stenting with a 3.0 x 23-mm Promus drug-eluting stent this       admission.      d.     Minimal disease in the other vessels.  2. Cigarette abuse.  3. History of systemic lupus erythematosus, ANA is weakly positive      this admission at less than 1:40 titer.   PROCEDURES:  Cardiac catheterization acutely with stenting of the LAD.   HISTORY:  This 50 year old female has a history of systemic lupus  erythematosus, evidently on medical disability.  She is followed by the  [facility] normally.  She was in her usual state  of health until the day of admission when she had the onset of  significant substernal chest discomfort around 9:00 a.m.  The pain  intensified, and she was brought to the emergency room.  Shortly after  arrival to the emergency room, she had acute ventricular fibrillation  and was resuscitated.  Subsequent EKG showed ST elevation in the  anterior leads, and she was brought to the catheterization laboratory  for intervention.  Please see the previously dictated history and  physical for remainder of the details.   HOSPITAL COURSE:  Lab data on admission shows a hemoglobin of 14.6,  hematocrit of 43, and white count of 10,700.  Sodium is 140, potassium  is 3.4, chloride is 108, CO2 was 24, glucose 94, BUN 6, and creatinine  of 0.63.  Liver enzymes were normal.   Initial point of care enzymes were  negative.  Her CPK subsequently was 101 with 7.3 MB, and troponin of  0.95.  A subsequent CPK was 78 with CPK-MB of 4.4, and troponin of 0.4.  TSH is 1.42.  Cholesterol is 97, triglycerides 45, HDL of 61, and LDL  was 27.   The patient was taken to the catheterization laboratory and had acute  catheterization done.  Left anterior descending had a 90-95% proximal  stenosis noted.  The circumflex coronary artery was normal.  The right  coronary artery had no significant disease.  Dr. Roger Shelter did the  stenting of the angioplasty acutely with a 3.0 x 23-mm Promus drug-  eluting stent.  The patient was loaded with Plavix.  She had an  excellent angiographic result with 0% residual stenosis.   The patient was followed in the CCU and had minimal elevation of her  cardiac enzymes.  It was felt that she likely had a reperfusion  ventricular fibrillation.  The case was discussed with Dr. Lewayne Bunting  who recommended follow her in the hospital on beta-blockers but no  additional therapy was needed.  She had an ejection fraction of 30% at  catheterization and failed to evolve any Q-waves on EKG.  She developed  significant T-wave inversion in anterolateral leads.  We will follow  this up with an echocardiogram in about 6 weeks.  She was seen by  cardiac rehabilitation and was given instruction and cigarette smoking  cessation.  She had no further arrhythmias.  She had some mild  hypokalemia that was evaluated and treated.  She was ambulatory in the  hall and had no further symptoms.  The importance of compliance with her  Plavix and aspirin was discussed with her.  She was given a card for 14  free Plavix and then will be given a prescription as an outpatient.   Her discharge condition is improved.   DISCHARGE MEDICATIONS:  1. Plavix 75 mg daily.  2. Aspirin 325 mg daily.  3. Lisinopril 10 mg daily.  4. Simvastatin 40 mg daily.  5. Metoprolol 50 mg  b.i.d.  6. Nitroglycerin 1/150 sublingual p.r.n.  She is instructed to follow      up with me in 1 week.  She is to take Plavix and was instructed not      to smoke.  She is to call if there are recurrent issues or problems      and was instructed in the use of nitroglycerin as needed for pain.      Georga Hacking, M.D.  Electronically Signed     WST/MEDQ  D:  07/08/2008  T:  07/08/2008  Job:  [id number]   cc:   [facility]  Colleen Can. Deborah Chalk, M.D.

## 2011-01-29 NOTE — H&P (Signed)
[name], CASSELLS NO.:  [id number]   MEDICAL RECORD NO.:  [id number]          PATIENT TYPE:  INP   LOCATION:  2911                         FACILITY:  [facility]   PHYSICIAN:  Georga Hacking, M.D.DATE OF BIRTH:  1961-05-20   DATE OF ADMISSION:  07/04/2008  DATE OF DISCHARGE:                              HISTORY & PHYSICAL   REASON FOR ADMISSION:  Chest pain.   HISTORY:  A 50 year old black female has a history of systemic lupus,  but is not currently under treatment and also is a smoker.  She has been  feeling well, but had the onset of midsternal chest discomfort around 8-  9 a.m. this morning, which became progressively worse over the morning,  unrelenting and associated shortness of breath and chest tightness.  She  had some sweating with it.  She presented to the [facility] Emergency  Room and she after being placed in room had a ventricular fibrillatory  arrest.  She was defibrillated and then continued to complain of chest  pain and EKG at that time showed an acute anterior infarction.  She was  brought to the Catheterization Laboratory Lab for acute intervention.  She has not had chest pain previously.   PAST MEDICAL HISTORY:  There is no history of hyperlipidemia or  diabetes, but does have a remote history of hypertension.  She is not  currently receiving any medical treatment.   PAST SURGICAL HISTORY:  Removal of an accessory breast.  In addition,  she has had a bilateral tubal ligation and maybe a hernia repair in the  past.   ALLERGIES:  None.   CURRENT MEDICATIONS:  None.   SOCIAL HISTORY:  She has several children.  She smokes a half pack of  cigarettes per day.  No significant illicit drug use.  Compliance is  probably okay.   FAMILY HISTORY:  Positive for heart disease in uncles and cousins.  Immediate family members do not have premature cardiac disease.   REVIEW OF SYSTEMS:  There is no history of gastrointestinal bleeding.  There is  no history of urinary tract infection.  She complains of  occasional moderate arthritis.  She denies shortness of breath normally.  No eye problems.  Other than as noted above, the review of systems is  unremarkable.   PHYSICAL EXAMINATION:  GENERAL:  She is a pleasant black female who is  in moderate chest pain initially.  VITAL SIGNS:  Her blood pressure in the emergency room was 160/100.  Pulse was 80.  SKIN:  Warm and dry.  ENT:  EOMI.  PERRLA.  CNS Clear.  Funduscopic examination was not done.  Pharynx is negative.  NECK:  Supple without masses, JVD, or thyromegaly.  LUNGS:  Clear to A & P.  CARDIAC:  Normal S1 and S2.  No S3.  ABDOMEN:  Soft and nontender.  Femoral distal pulses are 2+.  No edema  is noted.   A 12-lead EKG shows an anterior infarction and occasional PVCs noted.  Lab results pending at the time of dictation.   IMPRESSION:  1. Acute anterior myocardial infarction  with a ventricular      fibrillatory arrest.  2. History of systemic lupus.  3. Cigarette smoking.   RECOMMENDATIONS:  She will be taken directly to the Cardiac  Catheterization Laboratory for acute intervention.  The cardiac  catheterization procedure intervention was discussed with the patient  fully including risks of myocardial infarction, death, stroke, bleeding,  arrhythmia, dye allergy, and renal insufficiency.  She understands and  is willing to proceed.      Georga Hacking, M.D.  Electronically Signed     WST/MEDQ  D:  07/04/2008  T:  07/05/2008  Job:  [id number]   cc:   Colleen Can. Deborah Chalk, M.D.  [facility]

## 2011-01-29 NOTE — Discharge Summary (Signed)
[name], BUNNING NO.:  [id number]   MEDICAL RECORD NO.:  [id number]          PATIENT TYPE:  OUT   LOCATION:  [facility]                          FACILITY:  [facility]   PHYSICIAN:  [name], M.D.   DATE OF BIRTH:  11-21-1960   DATE OF ADMISSION:  DATE OF DISCHARGE:                               DISCHARGE SUMMARY   PRIMARY CARE PHYSICIAN:  Dr. Lambert Keto.   DISCHARGE DIAGNOSES:  1. Nausea, vomiting, and diarrhea, likely secondary to viral      gastroenteritis.  2. Renal impairment, resolved.  3. Tobacco abuse.  4. History of  alcohol abuse.  5. Hypertension.  6. Liver dysfunction with normal acute hepatitis panel and fatty      infiltration.  7. Elevated anion gap secondary to renal insufficiency and alcohol.  8. History of gastroesophageal reflux disease.  9. History of lupus, stable.  10.History of drug induced hepatotoxicity.  11.History of fibromyalgia.  12.History of lipodystrophy.  13.History of osteopenia.  14.Left hepatic lobe lesion, sonographic followup recommended in 3 to      6 months' time.  15.Complex right ovarian cyst.   DISCHARGE MEDICATIONS:  1. Reglan 10 mg as required for nausea up to 3 times per day.   DISPOSITION AND FOLLOWUP:  The patient is to follow with [facility].  Followup appointment has been given to the  patient.  At the followup visit, the patient will be reviewed in terms  of complete resolution of her problem of nausea, vomiting, and diarrhea  as well as abdominal pain.  A repeat CBC and CMET will be obtained to  look into renal impairment and liver function derangement.   STUDIES/PROCEDURES:  1. X-ray of abdomen, acute series on February 23, 2008, no active      cardiopulmonary disease and nonobstructive bowel gas pattern.  2. Ultrasound abdomen on February 23, 2008, fatty infiltration of the      liver.  Two small hypoechoic lesions in the left lobe of the liver,      not appearing to be simple  cysts.  Differential diagnosis include      complexities, atypical hemangioma, neoplasm or less likely, I      think, focal fatty sparing.  Abdominal MRI recommended.  3. MR abdomen and pelvis without and with contrast on February 23, 2008,      impression, two lesions in the lateral segment of left hepatic lobe      are faintly deep to hyperintense and demonstrate arterial phase      enhancement and faint consistent enhancement.  Differential      diagnostic considerations include small atypical hemangioma,      dysplastic liver nodules, and small focal nodular hyperplasia.      Hepatic cellular carcinoma and hypovascular metastatic disease are      both considered less likely, but these may likely need observation.      Given the excellent conspicuity by ultrasound, sonographic followup      is recommended in 3 to 6 months' time.  Alternatively, sonographic      biopsy may  be feasible.  Complex right ovarian cyst measuring 2.9      cm in diameter, more definite enhancement is identified      qualitatively, although quantitative assessment is limited due to      technical reasons.   CONSULT:  Most recent consults were requested during this hospital  admission.   ADMISSION HISTORY:  A 50 year old woman with past medical history of  SLE, GERD, hypertension, and drug-induced hepatotoxicity.  In 2008,  presented with 1-day history of nausea, vomiting, abdominal pain, and  diarrhea.  She reported feeling well day prior to admission and eating  normally, but she felt she was going to get sick.  At 9 a.m. on the day  of admission, she began vomiting.  She had vomited up to 20 times, but  there was no hematemesis.  Her emesis was yellowish and containing bile.  She also reported multiple episodes of diarrhea, but no blood in stool.  She has had diffuse abdominal pain 7/10, and some lower back pain.  There was no chest pain, fever, or chills.   ADMISSION PHYSICAL:  VITAL SIGNS:  Temperature 97.6,  blood pressure  171/81, pulse 63, respiratory rate 22, and oxygen saturation 100% on  room air.  GENERAL:  Alert and oriented x3.  No acute distress or sleepy.  EYES:  PERRL, EOMI, and sclerae clear.  ENT:  Dry mucous membranes.  No erythema.  No tonsillar enlargement.  NECK:  Supple.  No JVD.  No bruits.  CHEST:  Clear to auscultation bilaterally.  CARDIOVASCULAR:  Regular rate and rhythm.  No murmur, rub, or gallop.  ABDOMEN:  Decreased bowel sounds, but soft and nontender.  EXTREMITIES:  No edema, 2+ pulses.  Good capillary refill.  SKIN:  Multiple small bruises over knees and wrists.  LY MPS:  No lymphadenopathy.  NEURO:  Entirely nonfocal.  PSYCH:  Appropriate.   ADMISSION LABS:  Sodium 144, potassium 3.8, chloride 104, bicarbonate  26, BUN 15, creatinine 1.5, and blood glucose 170.  Hemoglobin 15.6, MCV  95.2, WBC 7.9, ANC 6.6, and platelets 411.  Anion gap 14, bilirubin 0.9,  alkaline phosphatase 89, AST 54, ALT 45, protein 10.7, albumin 5.4, and  calcium 10.6.  Troponin 0.01 and lipase 20.  Urinalysis:  Moderate bilirubin, 15 ketones, large blood, more than 300  protein, trace leukocyte esterase, 21 to 50 rbc's, 0-2 wbc, and rare  bacteria.  The patient was menstruating.   HOSPITAL COURSE:  1. Nausea, vomiting, and diarrhea.  This was most likely secondary to      viral gastroenteritis.  The patient was admitted for 23 hours      observation, given IV fluids, morphine, Zofran, and Tylenol.  The      patient's orthostatic vitals were monitored and stool studies were      sent.  2. Renal insufficiency.  This was most likely prerenal and responded      well to intravenous fluid treatment.  3. Elevated total protein and calcium.  This was again, most likely      secondary to dehydration and hemoconcentration and corrected with      rehydration.  4. Tobacco abuse.  The patient was given nicotine patch and smoking      cessation consult.  5. Alcohol use.  The patient was  placed on thiamine and folic acid.  6. Hypertension.  The patient's blood pressure was noted to be      slightly on the higher side.  The patient is not  on any      antihypertensive medications.  On top of that, since the patient      presented with dehydration, no new medication was started.  This      can be reviewed on outpatient basis and medications considered if      her blood pressure is persistently elevated.  7. Proteinuria and hematuria.  This was most likely related to patient      having menstruation.  8. Liver dysfunction.  Exact cause of this is not known, but as      mentioned earlier, there are suspicious lesions on the liver with      this following of note.  The patient's acute hepatitis panel was      negative.  9. Complex ovarian cyst.  This will also have to followed on      outpatient basis.  A referral to OB/GYN will be considered on an      outpatient basis.  10.GERD.  The patient was continued on proton pump inhibitor.  11.Lupus.  The patient was stable in terms of her history of lupus.   DISCHARGE DAY LABS:  Anemia panel:  Iron 131, ferritin 224, B12 of 520,  folate more than 20.  Hepatitis acute panel negative.  WBC 5.8,  hemoglobin 12.2, and platelets 246.  Sodium 142, potassium 3.8, chloride  109, bicarbonate 27, glucose 79, BUN 5, creatinine 0.73, and calcium  8.7.  HIV antibody nonreactive.   DISCHARGE DAY VITALS:  Temperature 97.9, pulse 73, respirations 18,  blood pressure 120/87, and oxygen saturation 94% on room air.   On the day of discharge, the patient was not complaining of any further  nausea, vomiting, diarrhea, or abdominal pain.  She was moving about and  eating normally.  Her abdominal examination was completely benign.  Other than that examination, did not change compared to our admission  and physical.      Zara Council, MD  Electronically Signed      [name], M.D.  Electronically Signed    AS/MEDQ  D:  02/29/2008   T:  03/01/2008  Job:  [id number]

## 2011-02-01 NOTE — Discharge Summary (Signed)
[name], DOLBY NO.:  [id number]   MEDICAL RECORD NO.:  [id number]                   PATIENT TYPE:  INP   LOCATION:  5006                                 FACILITY:  [facility]   PHYSICIAN:  Michel Harrow, MD                    DATE OF BIRTH:  June 07, 1961   DATE OF ADMISSION:  05/30/2002  DATE OF DISCHARGE:  06/01/2002                                 DISCHARGE SUMMARY   DISCHARGE DIAGNOSES:  1. Acute gastroenteritis.  2. Systemic lupus erythematosus, diagnosed in October 1996, with a history     of noncompliance.  3. History of gastroesophageal reflux disease.  4. History of alcoholic gastritis and alcohol abuse.  5. History of nonsteroidal anti-inflammatory drug gastritis.  6. 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  7. History of rosacea.  8. History of low back pain.  9. History of congenital absent/small right breast.  10.      Fibromyalgia.   DISCHARGE MEDICATIONS:  1. Protonix 40 mg p.o. q.d.  2. Neurontin 400 mg p.o. q.h.s.  3. Prednisone 5 mg p.o. q.d.  4. Valium 5 mg p.o. t.i.d.   DISPOSITION:  The patient was discharged to home in stable condition.   FOLLOW UP:  She had an already scheduled appointment with Dr. Nickie Retort in  the [facility] internal medicine outpatient clinic on June 23, 2002, at 10:30 in the morning. She was reminded of this appointment and  agreed to keep it.   PROCEDURES:  A CT scan of the abdomen and pelvis performed upon admission  was negative.   CONSULTS:  Dr. Marina Goodell with [facility].   HISTORY OF PRESENT ILLNESS:  For full details of admission history and  physical examination please see the chart. In brief the patient is a 40-year-  old African-American female with a history of systemic lupus erythematosus  diagnosed at [facility] in October 1996 with a history  of noncompliance. She said she has been seen by the rheumatologist at [facility]  in the past. She also has a history of  gastroesophageal reflux disease as well as a history of alcoholic gastritis  and alcohol abuse as well as NSAID gastritis.   She presented to the emergency room with nausea and vomiting. She says that  she started vomiting bilious nonbloody emesis on the morning of admission.  She has been vomiting continuously since then and has not been able to  take anything by mouth. She has also  had some nonmelenic, nonbloody  diarrhea this morning. She is very diaphoretic. She said that she has had  multiple episodes like this acute event. She thinks these are just like her  acute episodes. There is nausea and vomiting and diaphoresis that she has  had previously. She was last admitted for this in November 2001, and last  seen in the emergency room  for this in May of 2003. The patient's son states  that she drank a lot (approximately 12 beers) the previous weekend. The  patient states that she normally takes prednisone 5 mg p.o. q.d day but  takes this on an average of only five times per week because it makes her  eat a lot and also makes her hallucinate.   MEDICATIONS:  1. Prednisone 5 mg p.o. q.d.  2. Neurontin 400 mg p.o. q.h.s.  3. Vioxx 300 mg p.o. q.d.  4. Valium 5 mg p.o. t.i.d.  5. Nivadipine 150 mg p.o. b.i.d.   PHYSICAL EXAMINATION:  VITAL SIGNS:  Temperature 97.4, pulse 76,  respirations 18, blood pressure 161/102, O2 saturation 98% on room air.  GENERAL:  The patient is awake, retching and vomiting bilious, nonbloody  emesis. She is extremely diaphoretic.  HEENT:  Pupils equally round and reactive to light and accomodation. She had  poor dentition.  LUNGS:  Clear to auscultation bilaterally.  ABDOMEN:  Soft, nontender, nondistended with decreased bowel sounds.  BREASTS:  Exam not performed because of the patient's constant retching and  vomiting.   LABORATORY DATA:  Labs performed in the emergency room included a CBC which  showed white  blood cell count of 11, hemoglobin 14.3, hematocrit 42.3 and  platelets 307. She had 73% neutrophils and 22% lymphocytes with 5% monocytes  and 1% bands. Absolute neutrophil count was 8.1, and the mean corpuscular  volume was 92.2. A complete metabolic profile showed a sodium of 141,  potassium 3.6, chloride 108, CO2 18, BUN 16, creatinine 0.9, glucose 143  with an anion gap of 15, bilirubin of 0.4. The alkaline phosphatase was 69,  SGOT 22, SGPT 17, protein 9.0, albumin 4.8 and calcium 10.2. A urine beta  HCG was negative. The urine showed a small amount of bilirubin. Total  protein greater than 300 with a few bacteria and mucus. Lipase was low at  19.   HOSPITAL COURSE:  The patient was admitted to the medical teaching service  for  workup of her nausea and vomiting. The differential diagnosis included  acute gastritis, mesentery vasculitis secondary to lupus and peritonitis,  although the latter two diagnoses were thought to be less likely secondary  to the lack of abdominal pain. The patient was  made n.p.o. except for  medications and ice chips. She was hydrated aggressively. An  NG tube was  attempted, however, not successfully placed. A CT scan of the abdomen was  obtained, and the patient was given Zofran p.r.n. She was to be continued on  her prednisone if her CT scan of the abdomen was negative.  Upon admission  the plan was to increase her dose of prednisone if her CT scan showed signs  of mesenteric vasculitis, which eventually it did not. She was continued on  Neurontin for her fibromyalgia, but her Vioxx was held secondary to her  possible gastritis. She was put on Protonix for her gastroesophageal reflux  disease. She was given thiamin and folate and a urine drug screen was  checked secondary to a history of alcohol abuse. Blood cultures x 2 and a  urine culture were checked because of the patient's extreme diaphoresis.  PROBLEM #1, NAUSEA AND VOMITING:  The patient  improved soon after admission  with Zofran and hydration. The CT scan of the abdomen and pelvis was  negative. The patient was  evaluated by gastroenterology who felt that she  had an acute gastroenteritis and recommended proton pump inhibitor therapy  and advancing the diet as tolerated. They also recommended an EGD only if  the patient's symptoms persisted.   Two days after admission, the patient's symptoms had resolved. She tolerated  clears and was eventually advanced to solid foods. Because of the resolution  of her symptoms, she did not get a EGD by gastroenterology. She was  continued on Protonix.   PROBLEM #2, SYSTEMIC LUPUS ERYTHEMATOSUS:  The patient was continued on  prednisone 5 mg p.o. q.d. for her lupus. She was monitored for signs of  steroid insufficiency secondary to an acute illness, but no signs or  symptoms were ever evident. She was discharged home on her home dose of  prednisone.   PROBLEM #3, HISTORY OF ALCOHOL ABUSE:  The patient has no signs of  withdrawal throughout her hospitalization. She was monitored closely,  however, no benzodiazepines were ever necessary. The patient was  counseled  on her history of alcohol abuse.   PROBLEM #4, ANEMIA:  The patient had a slight anemia from 13.0 to 11.2 that  was thought to be secondary to her Gastroccult positive emesis.   PROBLEM #5, HYPOKALEMIA:  The patient had some hypokalemia throughout her  stay, most likely secondary to her vomiting. This was repleted.   CONDITION ON DISCHARGE:  Labs at discharge, a CBC on June 01, 2002,  demonstrated a white blood cell count of 5.7, hemoglobin 11.2, hematocrit  33.8 and platelets of 259,000. Her Gastroccult was positive on two separate  occasions. A BNP performed on June 01, 2002, showed a sodium of 141,  potassium 2.0 which was repleted, chloride 111, CO2 23, glucose 111, BUN 9,  creatinine 0.9 and calcium 8.1.  A repeat BNP later that day showed a  potassium of  4.9 some hemolysis. Blood cultures were negative x 2.                                               Michel Harrow, MD    KB/MEDQ  D:  07/12/2002  T:  07/13/2002  Job:  [id number]   cc:   Lonia Blood, M.D.  [address] [place], [place] [address]  Fax: [phone]   Wilhemina Bonito. Eda Keys., M.D. [facility]

## 2011-02-01 NOTE — Discharge Summary (Signed)
[facility]  Patient:    [name], [name]                       MRN: [id number] Adm. Date:  [id number] Disc. Date: [id number] Attending:  Madaline Guthrie Dictator:   Gillian Shields, M.D. CC:         [facility]   Discharge Summary  DISCHARGE DIAGNOSIS:  Gastroenteritis.  DISCHARGE MEDICATIONS: 1. Prednisone 5 mg p.o. q.d. 2. Calcium 500 mg p.o. t.i.d. 3. Phenergan 12.5 mg p.o. q.4h. p.r.n. 4. Ultram as prescribed by primary physician. 5. Percocet as prescribed by primary physician.  PROCEDURES:  None.  COMPLICATIONS:  None.  ADMISSION HISTORY AND PHYSICAL:  This 50 year old female with a history of lupus, who had been off her medication for one month, developed vomiting and diarrhea the morning of admission.  She vomited more than 10 times, a nonbloody, nonbilious fluid, and was unable to tolerate any p.o.  She also had two episodes of diarrheal stools.  At the time of presentation, she was light-headed, complaining of headache.  She had discontinued her medications one month prior to admission because they caused hallucinations and a sensation of diaphoresis.  She states that she has had similar symptoms with lupus exacerbations in the past, specifically citing GI upset, fatigue, and diaphoresis.  Her last admission for similar symptoms was in August of 1998. She denies sick contacts or recent illness, but complained of subjective fever over the last two weeks.  PAST MEDICAL HISTORY:  Significant for systemic lupus erythematosus diagnosed in October of 1996, a history of medical noncompliance, a history of alcoholic gastritis in October of 1996, NSAID gastritis in October of 1995, and status post bilateral tubal ligation.  SOCIAL HISTORY:  She is a smoker of one half pack per day.  Occasional use of alcohol with a history of alcohol abuse.  ALLERGIES:  No known drug allergies.  MEDICATIONS:  Prednisone, Skelaxin, Ultram, and  calcium.  FAMILY HISTORY:  Noncontributory.  ADMISSION PHYSICAL EXAMINATION:  Temperature 98.6 degrees, heart rate 58, respirations 26, blood pressure 158/98, saturating 100% on room air.  GENERAL APPEARANCE:  She was alert and sleepy, but arousable to voice. Oriented and in no acute distress.  HEENT:  Normocephalic and atraumatic.  Extraocular movements intact.  Pupils equal, round, and reactive to light and accommodating.  Oropharynx without lesions.  Mucosa dry and tacky.  NECK:  Supple with no lymphadenopathy and no JVD.  CARDIOVASCULAR:  Regular rate and rhythm.  No murmurs.  CHEST:  Clear to auscultation.  ABDOMEN:  Soft and nondistended with positive bowel sounds.  Mild epigastric tenderness.  No guarding.  No rebound.  EXTREMITIES:  2+ pulses.  No clubbing, cyanosis, or edema.  SKIN:  Warm and dry.  NEUROLOGIC:  She was grossly intact.  ADMISSION LABORATORIES:  Sodium 147, potassium 3.9, chloride 110, bicarbonate 28, BUN 17, creatinine 0.8, glucose 122, calcium 9.9, protein 9.3, albumin 4.7, AST 34, ALT 22, alkaline phosphatase 70, bilirubin 0.7, amylase 135, lipase 24.  White count 15.0, hemoglobin 13.7, hematocrit 37.9, platelets 431, absolute neutrophil count 13.3.  The beta hCG was negative. Urinalysis:  Specific gravity 1.027, significant for trace ketones, greater than 300 protein, microscopic mucus, and granular casts.  HOSPITAL COURSE: #1 - VOMITING AND DEHYDRATION:  She was rehydrated in the hospital.  She received IV Phenergan and Zofran, which improved her nausea.  The most likely etiology was gastroenteritis.  She also had considered nephrolithiasis, but she  was not in severe pain.  She also receive Pepcid for gastritis prophylaxis.  During the hospitalization, the emesis resolved and she did not have any further diarrheal stools.  #2 - HYPERNATREMIA:  This was thought most likely due to volume contraction and corrected with rehabilitation.  #3 -  SYSTEMIC LUPUS ERYTHEMATOSUS:  She was restarted on her medications, including prednisone.  I discussed with her the side effects of her medications.  She stated that she had discontinued it because she felt that it was causing some focal muscle wasting and necrosis.  She pointed out several divots in her arms and legs in the muscle tissue.  She does in fact have multiple areas of isolated muscle wasting, but no lesions that break through the skin.  The etiology of these is unknown and she was scheduled to follow up with a rheumatologist on July 09, 2000.  #4 - TOBACCO ABUSE:  Smoking cessation was offered and declined.  CONDITION ON DISCHARGE:  Stable.  FOLLOW-UP:  She will follow up at [facility] p.r.n. and with her rheumatologist on July 09, 2000. DD:  07/30/00 TD:  07/30/00 Job: [id number]

## 2011-03-18 ENCOUNTER — Emergency Department (HOSPITAL_COMMUNITY): Payer: PRIVATE HEALTH INSURANCE

## 2011-03-18 ENCOUNTER — Inpatient Hospital Stay (HOSPITAL_COMMUNITY)
Admission: EM | Admit: 2011-03-18 | Discharge: 2011-03-21 | DRG: 392 | Disposition: A | Discharge status: Home / Self Care | Payer: PRIVATE HEALTH INSURANCE | Attending: Internal Medicine | Admitting: Internal Medicine

## 2011-03-18 DIAGNOSIS — K219 Gastro-esophageal reflux disease without esophagitis: Secondary | ICD-10-CM | POA: Diagnosis present

## 2011-03-18 DIAGNOSIS — Z9861 Coronary angioplasty status: Secondary | ICD-10-CM

## 2011-03-18 DIAGNOSIS — R1115 Cyclical vomiting syndrome unrelated to migraine: Principal | ICD-10-CM | POA: Diagnosis present

## 2011-03-18 DIAGNOSIS — F17200 Nicotine dependence, unspecified, uncomplicated: Secondary | ICD-10-CM | POA: Diagnosis present

## 2011-03-18 DIAGNOSIS — I2510 Atherosclerotic heart disease of native coronary artery without angina pectoris: Secondary | ICD-10-CM | POA: Diagnosis present

## 2011-03-18 DIAGNOSIS — I10 Essential (primary) hypertension: Secondary | ICD-10-CM | POA: Diagnosis present

## 2011-03-18 DIAGNOSIS — F411 Generalized anxiety disorder: Secondary | ICD-10-CM | POA: Diagnosis present

## 2011-03-18 DIAGNOSIS — E876 Hypokalemia: Secondary | ICD-10-CM | POA: Diagnosis present

## 2011-03-18 DIAGNOSIS — E785 Hyperlipidemia, unspecified: Secondary | ICD-10-CM | POA: Diagnosis present

## 2011-03-18 DIAGNOSIS — L930 Discoid lupus erythematosus: Secondary | ICD-10-CM | POA: Diagnosis present

## 2011-03-18 LAB — URINALYSIS, ROUTINE W REFLEX MICROSCOPIC
Bilirubin Urine: NEGATIVE
Glucose, UA: NEGATIVE mg/dL
Hgb urine dipstick: NEGATIVE
Ketones, ur: NEGATIVE mg/dL
Protein, ur: NEGATIVE mg/dL
Urobilinogen, UA: 0.2 mg/dL (ref 0.0–1.0)

## 2011-03-18 LAB — CBC
Hemoglobin: 12.5 g/dL (ref 12.0–15.0)
MCH: 30.8 pg (ref 26.0–34.0)
MCHC: 34.6 g/dL (ref 30.0–36.0)
MCV: 88.9 fL (ref 78.0–100.0)
Platelets: 305 10*3/uL (ref 150–400)
RBC: 4.06 MIL/uL (ref 3.87–5.11)

## 2011-03-18 LAB — COMPREHENSIVE METABOLIC PANEL
ALT: 46 U/L — ABNORMAL HIGH (ref 0–35)
CO2: 17 mEq/L — ABNORMAL LOW (ref 19–32)
Calcium: 9.2 mg/dL (ref 8.4–10.5)
Creatinine, Ser: 0.64 mg/dL (ref 0.50–1.10)
GFR calc Af Amer: >60 mL/min (ref >60)
GFR calc non Af Amer: >60 mL/min (ref >60)
Glucose, Bld: 122 mg/dL — ABNORMAL HIGH (ref 70–99)
Sodium: 141 mEq/L (ref 135–145)
Total Protein: 8 g/dL (ref 6.0–8.3)

## 2011-03-18 LAB — DIFFERENTIAL
Basophils Relative: 0 % (ref 0–1)
Eosinophils Absolute: 0 10*3/uL (ref 0.0–0.7)
Lymphs Abs: 1.6 10*3/uL (ref 0.7–4.0)
Monocytes Absolute: 0.3 10*3/uL (ref 0.1–1.0)
Monocytes Relative: 3 % (ref 3–12)

## 2011-03-18 LAB — CK TOTAL AND CKMB (NOT AT ARMC)
CK, MB: 1.4 ng/mL (ref 0.3–4.0)
Relative Index: INVALID (ref 0.0–2.5)

## 2011-03-18 LAB — LIPASE, BLOOD: Lipase: 16 U/L (ref 11–59)

## 2011-03-19 ENCOUNTER — Encounter (HOSPITAL_COMMUNITY): Payer: Self-pay

## 2011-03-19 LAB — DIFFERENTIAL
Eosinophils Absolute: 0 10*3/uL (ref 0.0–0.7)
Lymphocytes Relative: 10 % — ABNORMAL LOW (ref 12–46)
Lymphs Abs: 1.1 10*3/uL (ref 0.7–4.0)
Monocytes Relative: 4 % (ref 3–12)
Neutro Abs: 9.5 10*3/uL — ABNORMAL HIGH (ref 1.7–7.7)
Neutrophils Relative %: 87 % — ABNORMAL HIGH (ref 43–77)

## 2011-03-19 LAB — RAPID URINE DRUG SCREEN, HOSP PERFORMED
Amphetamines: NOT DETECTED
Benzodiazepines: NOT DETECTED
Opiates: NOT DETECTED

## 2011-03-19 LAB — TROPONIN I
Troponin I: <0.3 ng/mL (ref <0.30)
Troponin I: <0.3 ng/mL (ref <0.30)

## 2011-03-19 LAB — BASIC METABOLIC PANEL
Chloride: 106 mEq/L (ref 96–112)
Creatinine, Ser: 0.49 mg/dL — ABNORMAL LOW (ref 0.50–1.10)
GFR calc Af Amer: >60 mL/min (ref >60)
Potassium: 3.5 mEq/L (ref 3.5–5.1)

## 2011-03-19 LAB — CBC
HCT: 34.7 % — ABNORMAL LOW (ref 36.0–46.0)
Hemoglobin: 12 g/dL (ref 12.0–15.0)
MCV: 89.2 fL (ref 78.0–100.0)
RBC: 3.89 MIL/uL (ref 3.87–5.11)
WBC: 11 10*3/uL — ABNORMAL HIGH (ref 4.0–10.5)

## 2011-03-19 LAB — CK TOTAL AND CKMB (NOT AT ARMC): Relative Index: INVALID (ref 0.0–2.5)

## 2011-03-19 NOTE — H&P (Signed)
[name], [name] NO.:  [id number]  MEDICAL RECORD NO.:  [id number]  LOCATION:  WLED                         FACILITY:  [facility]  PHYSICIAN:  Jeoffrey Massed, MD    DATE OF BIRTH:  14-Dec-1960  DATE OF ADMISSION:  03/18/2011 DATE OF DISCHARGE:                             HISTORY & PHYSICAL   PRIMARY CARE PHYSICIAN:  The patient's primary care practitioner is Dr. Lonia Blood.  CHIEF COMPLAINT:  Vomiting that started this afternoon.  HISTORY OF PRESENT ILLNESS:  The patient is a very pleasant 50 year old African-American female with a past medical history of coronary artery disease status post PTCA, history of lupus, history of tobacco abuse, comes in with the above-noted complaints.  Per the patient, she was in her usual state of health, was watching TV this afternoon and all of a sudden she started having profuse episodes of vomiting.  She also claimed that she also started having some vague generalized left-sided chest pain as well.  The patient claims she has vomited more than 10 times already today.  She claims she has also had 1 or 2 episodes of loose watery stools.  The patient claims she has mild abdominal pain and claims it is mostly soreness because of the vomiting and retching she has been doing.  She claims that the vomiting has now slowed down during my evaluation and she last vomited 20-30 minutes ago.  She is in fact hungry and is now asking for food.  The patient denies any ongoing active chest pain.  Denies any shortness of breath.  Denies any headache.  Denies any dysuria or hematuria.  She is now being admitted to the hospitalist service for further evaluation and management.  ALLERGIES:  The patient denies any known drug allergies.  PAST MEDICAL HISTORY: 1. Coronary artery disease status post LAD stenting. 2. Tobacco abuse. 3. Discoid lupus. 4. Gastroesophageal reflux disease. 5. Hypertension. 6. Anxiety. 7. Dyslipidemia.  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  MEDICATIONS AT HOME:  Include: 1. Hydrocodone Tylenol 5/500 one tablet p.o. q.6 h p.r.n. 2. Plavix 75 mg 1 tablet daily. 3. Ambien 10 mg 1 tablet daily. 4. Metoprolol 100 mg 1 tablet daily. 5. Losartan 100 mg 1 tablet daily. 6. Xanax 0.25 mg 1 tablet daily p.r.n. 7. Simvastatin 40 mg 1 tablet daily. 8. Aspirin 81 mg 1 tablet p.o. daily.  FAMILY HISTORY:  Positive for heart disease in uncles and cousins.  SOCIAL HISTORY:  The patient smokes around half to one pack of cigarettes a day and occasionally does marijuana and drinks socially. Denies any other toxic habits.  REVIEW OF SYSTEMS:  A detailed review of 12 systems was done and these are negative except for the ones noted in the HPI.  PHYSICAL EXAM:  GENERAL:  Lying in bed, in mild distress because of vomiting but awake and alert, answering all my questions appropriately. VITAL SIGNS:  Temperature 98.2, heart rate of 72, blood pressure 180/91 and respirations of 20.  Pulse ox is 97% on room air. HEENT:  Atraumatic, normocephalic.  Pupils are equally reactive to light and accommodation.  Oral mucosa appears dry. NECK:  Supple. CHEST:  Bilaterally clear to auscultation. CARDIOVASCULAR:  Heart sounds regular.  No murmurs heard. ABDOMEN:  Soft.  There is some mild tenderness in the epigastric region without any rebound, rigidity or guarding. EXTREMITIES:  No edema. NEUROLOGIC:  The patient is awake and alert.  Cranial nerves II through XII intact.  Speech is clear and she does not appear to have any focal neurological deficits.  LABORATORY DATA: 1. CBC shows a WBC of 10.1, hemoglobin of 12.5, hematocrit of 36.1 and     platelet count of 305. 2. Chemistry shows a sodium of 141, potassium of 3.2, chloride of 106,     bicarb of 17, glucose of 122, BUN of 23, creatinine of  0.64 and a     calcium of 9.2. 3. LFTs shows a total bilirubin of 0.1, alkaline phosphatase of 89,     AST of 43, ALT of 46, total protein of 8.0 and albumin of 4.4. 4. Lipase is 16. 5. First set of cardiac enzymes are negative. 6. Urinalysis is negative.  RADIOLOGIC STUDIES:  Acute abdominal series showed no acute evidence of acute cardiopulmonary disease.  No evidence of obstruction or free air.  ASSESSMENT: 1. Intractable vomiting of unknown etiology.  Apparently, this has     happened to the patient before per E chart and also per the patient     and it has resolved with symptomatic treatment.  I am not sure     whether this is a viral syndrome or is flare of underlying lupus.     At this time, we will provide supportive care and follow her     clinical course. 2. Hypokalemia.  It is probably secondary to GI loss. 3. Uncontrolled hypertension.  Given the fact that the patient is very     nauseous and had profuse vomiting, we will put on IV Lopressor and     p.r.n. hydralazine as ordered. 4. History of coronary artery disease status post PTCA.  PLAN: 1. The patient will be admitted to a telemetry unit. 2. Cardiac enzymes will be cycled given the fact that she did have     some chest pain, however, this chest pain is probably secondary to     reflux more than vomiting. 3. She will be kept n.p.o. and given IV fluids. 4. We will give her Reglan 5 mg IV t.i.d. schedule and use Zofran and     Phenergan on a p.r.n. basis. 5. She will be provided with Protonix for her gastroesophageal reflux     disease. 6. Cardiac enzymes will be cycled.  Her Aspirin and Plavix will be     continued. 7. For uncontrolled hypertension, we will give her IV Lopressor every     6 hours scheduled and use hydralazine on a p.r.n. basis.     Hopefully, once she tolerates oral intake, she could resume all     usual antihypertensive medications. 8. She is mildly hypokalemic.  We will replete the potassium  as well. 9. Further plan will depend as the patient's clinical course evolves. 10.DVT prophylaxis with Lovenox.  CODE STATUS:  Full code.  TOTAL TIME SPENT:  45 minutes.     Jeoffrey Massed, MD     SG/MEDQ  D:  03/18/2011  T:  03/18/2011  Job:  [id number]  cc:   Lonia Blood, M.D.  Electronically Signed by Jeoffrey Massed  on 03/19/2011 03:46:01 PM

## 2011-03-20 LAB — COMPREHENSIVE METABOLIC PANEL
AST: 24 U/L (ref 0–37)
BUN: 9 mg/dL (ref 6–23)
CO2: 21 mEq/L (ref 19–32)
Calcium: 8.6 mg/dL (ref 8.4–10.5)
Creatinine, Ser: 0.53 mg/dL (ref 0.50–1.10)
GFR calc Af Amer: >60 mL/min (ref >60)
GFR calc non Af Amer: >60 mL/min (ref >60)

## 2011-03-20 LAB — CBC
MCH: 30.4 pg (ref 26.0–34.0)
MCV: 89.4 fL (ref 78.0–100.0)
Platelets: 294 10*3/uL (ref 150–400)
RBC: 3.85 MIL/uL — ABNORMAL LOW (ref 3.87–5.11)

## 2011-03-21 LAB — COMPREHENSIVE METABOLIC PANEL
ALT: 26 U/L (ref 0–35)
AST: 28 U/L (ref 0–37)
Alkaline Phosphatase: 73 U/L (ref 39–117)
CO2: 28 mEq/L (ref 19–32)
Chloride: 100 mEq/L (ref 96–112)
GFR calc Af Amer: >60 mL/min (ref >60)
GFR calc non Af Amer: >60 mL/min (ref >60)
Glucose, Bld: 108 mg/dL — ABNORMAL HIGH (ref 70–99)
Potassium: 3.3 mEq/L — ABNORMAL LOW (ref 3.5–5.1)
Sodium: 136 mEq/L (ref 135–145)

## 2011-03-21 LAB — CBC
Hemoglobin: 13.2 g/dL (ref 12.0–15.0)
RBC: 4.33 MIL/uL (ref 3.87–5.11)
WBC: 8.6 10*3/uL (ref 4.0–10.5)

## 2011-03-28 NOTE — Discharge Summary (Signed)
  NAMEKELSEY, [name] NO.:  [id number]  MEDICAL RECORD NO.:  [id number]  LOCATION:  1442                         FACILITY:  [facility]  PHYSICIAN:  Conley Canal, MD      DATE OF BIRTH:  July 19, 1961  DATE OF ADMISSION:  03/18/2011 DATE OF DISCHARGE:  03/21/2011                        DISCHARGE SUMMARY - REFERRING   PRIMARY CARE PHYSICIAN:  Lonia Blood, MD  DISCHARGE DIAGNOSES: 1. Intractable nausea and vomiting, etiology unclear, self-limiting     with supportive care. 2. Coronary artery disease, status post LAD stenting. 3. Tobacco habituation. 4. Discoid lupus. 5. Gastroesophageal reflux disease. 6. Hypertension. 7. Anxiety disorder. 8. Hyperlipidemia.  DISCHARGE MEDICATIONS: 1. Prilosec 20 mg daily. 2. Lopressor 100 mg twice daily. 3. Ambien 10 mg at bedtime p.r.n. 4. Aspirin 81 mg daily. 5. Vicodin 5/500 mg q.6 hourly as needed.  No new prescription given. 6. Losartan 100 mg daily. 7. Plavix 75 mg daily. 8. Zocor 40 mg daily. 9. Xanax 0.25 mg t.i.d. as needed.  PROCEDURES PERFORMED: 1. Abdominal x-ray on July 2 showed no evidence of acute     cardiopulmonary disease.  No evidence of obstruction or free air. 2. CT abdomen and pelvis on July 3 showed no acute findings.  HOSPITAL COURSE:  This extremely pleasant 50 year old female with comorbidities as noted above came to the hospital with complaints of intractable nausea and vomiting.  She denied any fever or diarrhea prior to the admission.  She has had episodes like these previously.  She believes it may have to do with the multiple medications she takes.  She denied any sick contacts so at admission she had a CT abdomen and pelvis which did not show any acute findings.  Her labs included electrolytes showing some hypokalemia as well as hypophosphatemia, probably from GI loss.  Sedimentation rate was 24.  Her white count was normal, so was a hemoglobin and platelet count.  Urine drug screen was  positive for tetra- cannabinoids.  Lipase was also normal.  Cardiac enzymes were negative x3, hence the focus was on symptom control initially with Phenergan which did not seem to help much; however, Reglan was added.  This seems to be more effective.  Today she feels much better; she is eager to go home.  The plan will be to discharge her to follow up with Dr. Mikeal Hawthorne in the next month as initially scheduled unless she develops symptoms then she can have an earlier appointment all be seen in the emergency room.  Of note is that her blood pressure was not optimally controlled probably because her oral medications were held at the time of admission because of the vomiting.  She may need adjustments of her current regimen, which includes losartan 100 mg daily, Lopressor 100 mg twice daily; I am going to defer this decision to Dr. Mikeal Hawthorne.  The patient is discharged in stable condition.  The time spent for discharge preparation is less than 30 minutes.     Conley Canal, MD     SR/MEDQ  D:  03/21/2011  T:  03/21/2011  Job:  [id number] cc:   Lonia Blood, M.D.  Electronically Signed by Conley Canal  on 03/28/2011 07:30:03 PM

## 2011-06-13 LAB — COMPREHENSIVE METABOLIC PANEL
ALT: 45 — ABNORMAL HIGH
AST: 54 — ABNORMAL HIGH
Albumin: 5.4 — ABNORMAL HIGH
Calcium: 10.6 — ABNORMAL HIGH
Creatinine, Ser: 1.58 — ABNORMAL HIGH
GFR calc Af Amer: 43 — ABNORMAL LOW
GFR calc non Af Amer: 35 — ABNORMAL LOW
Sodium: 144
Total Protein: 10.7 — ABNORMAL HIGH

## 2011-06-13 LAB — URINALYSIS, ROUTINE W REFLEX MICROSCOPIC
Glucose, UA: NEGATIVE
Ketones, ur: 15 — AB
Specific Gravity, Urine: 1.025
pH: 6

## 2011-06-13 LAB — CBC
HCT: 35.7 — ABNORMAL LOW
HCT: 45.4
Hemoglobin: 15.6 — ABNORMAL HIGH
MCHC: 34.2
MCHC: 34.4
MCV: 95.9
Platelets: 246
Platelets: 411 — ABNORMAL HIGH
RDW: 13.1
RDW: 13.3

## 2011-06-13 LAB — ACETAMINOPHEN LEVEL: Acetaminophen (Tylenol), Serum: <10 — ABNORMAL LOW

## 2011-06-13 LAB — URINE MICROSCOPIC-ADD ON

## 2011-06-13 LAB — PROTEIN ELECTROPH W RFLX QUANT IMMUNOGLOBULINS
Albumin ELP: 52.7 — ABNORMAL LOW
Alpha-2-Globulin: 10.6
Beta 2: 5.6
Beta Globulin: 5.9
Total Protein ELP: 7.5

## 2011-06-13 LAB — BASIC METABOLIC PANEL
BUN: 14
BUN: 5 — ABNORMAL LOW
CO2: 27
Chloride: 105
Chloride: 109
Creatinine, Ser: 0.73
Creatinine, Ser: 1.05
GFR calc non Af Amer: 56 — ABNORMAL LOW
Glucose, Bld: 165 — ABNORMAL HIGH
Glucose, Bld: 79
Potassium: 3.6
Potassium: 3.8

## 2011-06-13 LAB — HEPATITIS PANEL, ACUTE: Hep A IgM: NEGATIVE

## 2011-06-13 LAB — DIFFERENTIAL
Basophils Absolute: 0
Basophils Relative: 0
Eosinophils Relative: 0
Lymphocytes Relative: 9 — ABNORMAL LOW
Monocytes Absolute: 0.6

## 2011-06-13 LAB — COMPREHENSIVE METABOLIC PANEL WITH GFR
Alkaline Phosphatase: 89
BUN: 15
CO2: 26
Chloride: 104
Glucose, Bld: 170 — ABNORMAL HIGH
Potassium: 3.8
Total Bilirubin: 0.9

## 2011-06-13 LAB — HEPATIC FUNCTION PANEL
ALT: 30
AST: 39 — ABNORMAL HIGH
Alkaline Phosphatase: 63
Total Protein: 7.4

## 2011-06-13 LAB — RETICULOCYTES
RBC.: 4
Retic Ct Pct: 0.3 — ABNORMAL LOW

## 2011-06-13 LAB — CREATININE, URINE, RANDOM: Creatinine, Urine: 347.9

## 2011-06-13 LAB — LIPASE, BLOOD: Lipase: 20

## 2011-06-13 LAB — POCT I-STAT, CHEM 8
BUN: 18
Calcium, Ion: 1.13
HCT: 50 — ABNORMAL HIGH
Hemoglobin: 17 — ABNORMAL HIGH
Sodium: 143
TCO2: 26

## 2011-06-13 LAB — FOLATE: Folate: >20

## 2011-06-13 LAB — ETHANOL: Alcohol, Ethyl (B): <5

## 2011-06-13 LAB — TROPONIN I: Troponin I: 0.01

## 2011-06-13 LAB — SODIUM, URINE, RANDOM: Sodium, Ur: 48

## 2011-06-13 LAB — IRON AND TIBC: Saturation Ratios: 41

## 2011-06-17 LAB — COMPREHENSIVE METABOLIC PANEL
ALT: 20
Albumin: 4.1
Alkaline Phosphatase: 60
BUN: 7
Calcium: 8.4
Creatinine, Ser: 0.87
GFR calc Af Amer: >60
GFR calc Af Amer: >60
Glucose, Bld: 94
Potassium: 2.8 — ABNORMAL LOW
Sodium: 140
Total Protein: 7.5
Total Protein: 8

## 2011-06-17 LAB — CBC
HCT: 41.5
Hemoglobin: 11 — ABNORMAL LOW
MCHC: 33.1
MCHC: 33.6
Platelets: 379
RBC: 3.37 — ABNORMAL LOW
RDW: 12.9
RDW: 13
RDW: 13

## 2011-06-17 LAB — BASIC METABOLIC PANEL
BUN: 5 — ABNORMAL LOW
CO2: 22
CO2: 24
Calcium: 7.8 — ABNORMAL LOW
Chloride: 105
Chloride: 106
Creatinine, Ser: 0.67
Creatinine, Ser: 0.68
GFR calc Af Amer: >60
GFR calc Af Amer: >60
GFR calc non Af Amer: >60
Glucose, Bld: 83
Glucose, Bld: 86
Glucose, Bld: 93
Potassium: 3.8
Potassium: 4.2
Sodium: 133 — ABNORMAL LOW
Sodium: 136

## 2011-06-17 LAB — CK TOTAL AND CKMB (NOT AT ARMC): Relative Index: INVALID

## 2011-06-17 LAB — PROTIME-INR: INR: 1.1

## 2011-06-17 LAB — ETHANOL: Alcohol, Ethyl (B): <5

## 2011-06-17 LAB — DIFFERENTIAL
Eosinophils Absolute: 0
Lymphocytes Relative: 14
Lymphocytes Relative: 29
Lymphs Abs: 1.6
Monocytes Absolute: 0.6
Monocytes Relative: 2 — ABNORMAL LOW
Monocytes Relative: 5
Neutro Abs: 6.8
Neutrophils Relative %: 84 — ABNORMAL HIGH

## 2011-06-17 LAB — POCT I-STAT, CHEM 8
BUN: 6
Creatinine, Ser: 0.8
Potassium: 3.5
Sodium: 142

## 2011-06-17 LAB — CARDIAC PANEL(CRET KIN+CKTOT+MB+TROPI)
CK, MB: 7.3 — ABNORMAL HIGH
Relative Index: INVALID
Total CK: 101
Total CK: 94
Troponin I: 0.95

## 2011-06-17 LAB — HEMOGLOBIN A1C
Hgb A1c MFr Bld: 5.9
Mean Plasma Glucose: 123

## 2011-06-17 LAB — LIPID PANEL: Cholesterol: 97

## 2011-06-17 LAB — POCT CARDIAC MARKERS
CKMB, poc: 2.1
Troponin i, poc: <0.05

## 2011-06-17 LAB — TSH: TSH: 1.42

## 2011-06-17 LAB — APTT: aPTT: 37

## 2011-06-17 LAB — TROPONIN I: Troponin I: 0.4 — ABNORMAL HIGH

## 2011-06-18 LAB — CULTURE, ROUTINE-ABSCESS

## 2011-09-04 ENCOUNTER — Encounter (HOSPITAL_COMMUNITY): Payer: Self-pay

## 2011-09-04 ENCOUNTER — Encounter (HOSPITAL_COMMUNITY): Payer: Self-pay | Admitting: Emergency Medicine

## 2011-09-04 ENCOUNTER — Emergency Department (HOSPITAL_COMMUNITY)
Admission: EM | Admit: 2011-09-04 | Discharge: 2011-09-04 | Disposition: A | Discharge status: Home / Self Care | Payer: PRIVATE HEALTH INSURANCE | Attending: Emergency Medicine | Admitting: Emergency Medicine

## 2011-09-04 ENCOUNTER — Emergency Department (INDEPENDENT_AMBULATORY_CARE_PROVIDER_SITE_OTHER)
Admission: EM | Admit: 2011-09-04 | Discharge: 2011-09-04 | Disposition: A | Discharge status: Nursing Home (Includes ICF) | Payer: PRIVATE HEALTH INSURANCE | Source: Home / Self Care | Attending: Emergency Medicine | Admitting: Emergency Medicine

## 2011-09-04 ENCOUNTER — Emergency Department (HOSPITAL_COMMUNITY): Payer: PRIVATE HEALTH INSURANCE

## 2011-09-04 DIAGNOSIS — R1310 Dysphagia, unspecified: Secondary | ICD-10-CM | POA: Insufficient documentation

## 2011-09-04 DIAGNOSIS — J36 Peritonsillar abscess: Secondary | ICD-10-CM

## 2011-09-04 DIAGNOSIS — F17200 Nicotine dependence, unspecified, uncomplicated: Secondary | ICD-10-CM | POA: Insufficient documentation

## 2011-09-04 DIAGNOSIS — Z79899 Other long term (current) drug therapy: Secondary | ICD-10-CM | POA: Insufficient documentation

## 2011-09-04 DIAGNOSIS — R220 Localized swelling, mass and lump, head: Secondary | ICD-10-CM | POA: Insufficient documentation

## 2011-09-04 DIAGNOSIS — Z7982 Long term (current) use of aspirin: Secondary | ICD-10-CM | POA: Insufficient documentation

## 2011-09-04 DIAGNOSIS — H9209 Otalgia, unspecified ear: Secondary | ICD-10-CM | POA: Insufficient documentation

## 2011-09-04 DIAGNOSIS — M542 Cervicalgia: Secondary | ICD-10-CM | POA: Insufficient documentation

## 2011-09-04 DIAGNOSIS — I10 Essential (primary) hypertension: Secondary | ICD-10-CM | POA: Insufficient documentation

## 2011-09-04 DIAGNOSIS — M329 Systemic lupus erythematosus, unspecified: Secondary | ICD-10-CM | POA: Insufficient documentation

## 2011-09-04 DIAGNOSIS — I2510 Atherosclerotic heart disease of native coronary artery without angina pectoris: Secondary | ICD-10-CM | POA: Insufficient documentation

## 2011-09-04 HISTORY — DX: Atherosclerotic heart disease of native coronary artery without angina pectoris: I25.10

## 2011-09-04 HISTORY — DX: Essential (primary) hypertension: I10

## 2011-09-04 LAB — BASIC METABOLIC PANEL
BUN: 15 mg/dL (ref 6–23)
Calcium: 9.2 mg/dL (ref 8.4–10.5)
Chloride: 101 mEq/L (ref 96–112)
Creatinine, Ser: 0.66 mg/dL (ref 0.50–1.10)
GFR calc Af Amer: >90 mL/min (ref >90)
GFR calc non Af Amer: >90 mL/min (ref >90)

## 2011-09-04 LAB — POCT RAPID STREP A: Streptococcus, Group A Screen (Direct): NEGATIVE

## 2011-09-04 MED ORDER — MORPHINE SULFATE 4 MG/ML IJ SOLN
4.0000 mg | Freq: Once | INTRAMUSCULAR | Status: AC
  Administered 2011-09-04: 4 mg via INTRAVENOUS
  Filled 2011-09-04: qty 1

## 2011-09-04 MED ORDER — CLINDAMYCIN PHOSPHATE 600 MG/50ML IV SOLN
600.0000 mg | Freq: Once | INTRAVENOUS | Status: DC
  Filled 2011-09-04: qty 50

## 2011-09-04 MED ORDER — MORPHINE SULFATE 2 MG/ML IJ SOLN
INTRAMUSCULAR | Status: AC
  Filled 2011-09-04: qty 1

## 2011-09-04 MED ORDER — MORPHINE SULFATE 2 MG/ML IJ SOLN
2.0000 mg | Freq: Once | INTRAMUSCULAR | Status: AC
  Administered 2011-09-04: 2 mg via INTRAMUSCULAR

## 2011-09-04 MED ORDER — DEXAMETHASONE SODIUM PHOSPHATE 10 MG/ML IJ SOLN
20.0000 mg | Freq: Once | INTRAMUSCULAR | Status: AC
  Administered 2011-09-04: 20 mg via INTRAVENOUS
  Filled 2011-09-04 (×2): qty 1

## 2011-09-04 MED ORDER — SODIUM CHLORIDE 0.9 % IV BOLUS (SEPSIS)
1000.0000 mL | Freq: Once | INTRAVENOUS | Status: AC
  Administered 2011-09-04: 1000 mL via INTRAVENOUS

## 2011-09-04 MED ORDER — CLINDAMYCIN PHOSPHATE 900 MG/50ML IV SOLN
900.0000 mg | INTRAVENOUS | Status: AC
  Administered 2011-09-04: 900 mg via INTRAVENOUS
  Filled 2011-09-04: qty 50

## 2011-09-04 MED ORDER — IOHEXOL 300 MG/ML  SOLN
75.0000 mL | Freq: Once | INTRAMUSCULAR | Status: AC | PRN
  Administered 2011-09-04: 75 mL via INTRAVENOUS

## 2011-09-04 NOTE — ED Provider Notes (Signed)
I saw and evaluated the patient, reviewed the resident's note and I agree with the findings and plan. 50 year old, female complains of progressive pain in the right side of her throat, with dysphasia.  She denies fevers, cough, or shortness of breath.  She smokes, but has not had recent dental surgery.  On examination.  She does get in swelling and tenderness in her right submandibular area with trismus and swelling in the right side of her oropharynx with mild deviation of the uvula to the left.  CT of the neck shows a abscess.  We will establish an IV of antibiotics and consult ENT.  Nicholes Stairs, MD 09/04/11 1250

## 2011-09-04 NOTE — ED Notes (Signed)
Patient transported to CT 

## 2011-09-04 NOTE — ED Provider Notes (Signed)
3:35 PM Patient is in CDU holding for ENT to drain peritonsillar abscess.  Pt reports pain is well controlled, no difficulty breathing.  On exam, pt is A&Ox4, NAD, tonsilar enlargement R>L, RRR, CTAB, no stridor.    5:59 PM Patient seen by Dr Suszanne Conners in CDU.  Dr Suszanne Conners has drained abscess, given patient prescriptions, and scheduled follow up appointment with her for Monday, Dec 24.  Patient is ready for discharge.    Dillard Cannon [place], [place] 09/04/11 1759

## 2011-09-04 NOTE — ED Notes (Signed)
Pt from [facility] with peritonsillar abscess; pt with sore throat not getting better x 1 week; pt given 2mg  IM morphine at [facility]; pt with hx of lupus

## 2011-09-04 NOTE — Consult Note (Signed)
Reason for Consult: Right peritonsillar abscess Referring Physician: Cheri Guppy, MD  HPI:  [name] is an 50 y.o. female who presents with one week of progressively worsening throat pain. Over the past three days she has developed significant pain with swallowing and went to an urgent care today where she was diagnosed with a peritonsillar abscess. At this time she complains of pain with opening her mouth wide, pain in her right neck radiating to her right ear, and pain with swallowing. No fevers/chills, nasal discharge, tooth pain, cough, nausea/vomiting. She has had decreased oral intake secondary to pain. No previous h/o tonsillitis or PTA.    Past Medical History  Diagnosis Date  . Hypertension   . Coronary artery disease   . Lupus     Past Surgical History  Procedure Date  . Mastectomy     History reviewed. No pertinent family history.  Social History:  reports that she has been smoking.  She does not have any smokeless tobacco history on file. She reports that she drinks alcohol. She reports that she does not use illicit drugs.  Allergies:  Allergies  Allergen Reactions  . Lisinopril     REACTION: Cough    Medications:  I have reviewed the patient's current medications. Home Medications    Current Outpatient Rx   Name  Route  Sig  Dispense  Refill   .  ALPRAZOLAM 0.25 MG PO TABS  Oral  Take 0.25 mg by mouth daily as needed. For when nervous     .  ASPIRIN EC 81 MG PO TBEC  Oral  Take 81 mg by mouth daily.     Marland Kitchen  CLOPIDOGREL BISULFATE 75 MG PO TABS  Oral  Take 75 mg by mouth daily.     Marland Kitchen  DICLOFENAC SODIUM 50 MG PO TBEC  Oral  Take 50 mg by mouth daily.     Marland Kitchen  LOSARTAN POTASSIUM 100 MG PO TABS  Oral  Take 100 mg by mouth daily.     Marland Kitchen  METOPROLOL TARTRATE 25 MG PO TABS  Oral  Take 25 mg by mouth 2 (two) times daily.     Marland Kitchen  SIMVASTATIN 40 MG PO TABS  Oral  Take 40 mg by mouth at bedtime.       Scheduled:   . clindamycin (CLEOCIN) IV  900 mg Intravenous  To Major  . dexamethasone  20 mg Intravenous Once  .  morphine injection  4 mg Intravenous Once  .  morphine injection  4 mg Intravenous Once  . sodium chloride  1,000 mL Intravenous Once  . DISCONTD: clindamycin (CLEOCIN) IV  600 mg Intravenous Once    Results for orders placed during the hospital encounter of 09/04/11 (from the past 48 hour(s))  BASIC METABOLIC PANEL     Status: Abnormal   Collection Time   09/04/11 12:28 PM      Component Value Range Comment   Sodium 136  135 - 145 (mEq/L)    Potassium 4.2  3.5 - 5.1 (mEq/L) HEMOLYSIS AT THIS LEVEL MAY AFFECT RESULT   Chloride 101  96 - 112 (mEq/L)    CO2 24  19 - 32 (mEq/L)    Glucose, Bld 67 (*) 70 - 99 (mg/dL)    BUN 15  6 - 23 (mg/dL)    Creatinine, Ser 1.61  0.50 - 1.10 (mg/dL)    Calcium 9.2  8.4 - 10.5 (mg/dL)    GFR calc non Af Amer >90  >90 (mL/min)  GFR calc Af Amer >90  >90 (mL/min)     Ct Soft Tissue Neck W Contrast  09/04/2011  *RADIOLOGY REPORT*  Clinical Data: Progressive sore throat.  History of lupus.  CT NECK WITH CONTRAST  Technique:  Multidetector CT imaging of the neck was performed with intravenous contrast.  Contrast: 75mL OMNIPAQUE IOHEXOL 300 MG/ML IV SOLN  Comparison: None.  Findings: Diffuse inflammation of the palatine tonsils much more notable on the right where there is an abscess collection throughout the right palatine tonsil measuring 1.3 x 1.1 cm maximal transverse dimension and spanning over 4.5 cm in length.  This is narrowing the airway with inflammation extending into the right posterior-superior nasopharyngeal region, into the soft palate and right aryepiglottic fold region with mild thickening of the epiglottis.  Mild breakthrough of inflammatory process into the right parapharyngeal space with infiltration of fat planes which extend in a retropharyngeal position of but without deep drainable abscess.  Adenopathy most notable right level II region measuring 1.5 x 1.1 cm maximal transverse  dimension spanning over 2.1 cm in length.  No evidence of septic thrombophlebitis of the right internal jugular vein.  Lucency along the upper right pre molars suggesting loosening with mild thickening of the maxillary sinuses.  Infection not excluded.  Cervical spondylotic changes most prominent C6-7.  Left apical lung parenchymal changes may represent scarring without associated bony destruction.  Stability can be confirmed on follow- up.  IMPRESSION: Abscess collection right palatine tonsil as detailed above.  Adenopathy most notable right level II region measuring 1.5 x 1.1 cm maximal transverse dimension spanning over 2.1 cm in length.  Lucency along the upper right pre molars suggesting loosening with mild thickening of the maxillary sinuses.  Infection not excluded.  Critical Value/emergent results were called by telephone at the time of interpretation on 09/04/2011  at 12:30 p.m.  to  Dr. Nino Parsley, who verbally acknowledged these results.  Original Report Authenticated By: Fuller Canada, M.D.   Review of Systems  Constitutional: Negative.  HENT: Positive for ear pain, sore throat, drooling, trouble swallowing and neck pain. Negative for hearing loss, congestion, facial swelling, rhinorrhea, sneezing, neck stiffness, dental problem, voice change, postnasal drip and sinus pressure.  Eyes: Negative.  Respiratory: Negative.  Cardiovascular: Negative.  Gastrointestinal: Negative.  Genitourinary: Negative.  Musculoskeletal: Negative for back pain and arthralgias.  Skin: Negative.  Neurological: Negative.  Hematological: Negative.   Blood pressure 118/77, pulse 74, temperature 97.7 F (36.5 C), temperature source Oral, resp. rate 20, SpO2 99.00%.  Physical Exam  Constitutional: She is oriented to person, place, and time. She appears well-developed and well-nourished.  HENT:  Unable to open jaw completely. There is significant swelling and erythema of the right side of soft palate and superior  tonsillar pole. Uvula deviated to the left. Soft palate rises asymmetrically.  No dental lesions appreciated. Gums are normal in appearance.  Eyes: Conjunctivae and EOM are normal.  Neck: Normal range of motion. Neck supple.  Cardiovascular: Normal rate, regular rhythm and normal heart sounds.  Pulmonary/Chest: Effort normal and breath sounds normal.  Musculoskeletal: Normal range of motion. She exhibits no edema.  Lymphadenopathy: She has no palpable cervical adenopathy.  Neurological: She is alert and oriented to person, place, and time.  Skin: Skin is warm and dry.   Procedure: Incision and drainage of the right peritonsillar abscess.   Anesthesia: local anesthesia with 1% lidocaine with epinephrine.   Description: The patient is placed upright on the hospital bed.  After adequate  local anesthesia is achieved, an 18-G spinal needle is used to make multiple passes over the right superior tonsillar pole.  Approximately 1 cc of purulent fluid was evacuated.  The abscess cavity was incised opened with the spinal needle tip.  The patient tolerated the procedure well.    Assessment/Plan: Right peritonsillar abscess  - IV clindamycin and decadron in ER - I&D of right PTA in ER - D/C home with clindamycin 300mg  po QID for 10 days, prednisone dose pak (10mg /6 days), and vicodin prn pain (#20). - F/U in my office in 1 week  Eladia Frame,SUI W 09/04/2011, 5:54 PM

## 2011-09-04 NOTE — ED Provider Notes (Signed)
History     CSN: [id number] Arrival date & time: 09/04/2011  9:21 AM   First MD Initiated Contact with Patient 09/04/11 1005      Chief Complaint  Patient presents with  . Sore Throat    (Consider location/radiation/quality/duration/timing/severity/associated sxs/prior treatment) Patient is a 50 y.o. female presenting with pharyngitis. The history is provided by the patient.  Sore Throat This is a new problem. The current episode started more than 1 week ago. The problem occurs constantly. The problem has been gradually worsening. Associated symptoms include headaches. Pertinent negatives include no abdominal pain and no shortness of breath. The symptoms are aggravated by swallowing.    Past Medical History  Diagnosis Date  . Hypertension   . Coronary artery disease   . Lupus     Past Surgical History  Procedure Date  . Mastectomy     History reviewed. No pertinent family history.  History  Substance Use Topics  . Smoking status: Current Everyday Smoker -- 0.5 packs/day  . Smokeless tobacco: Not on file  . Alcohol Use: Yes    OB History    Grav Para Term Preterm Abortions TAB SAB Ect Mult Living                  Review of Systems  HENT: Positive for congestion, sore throat, drooling and trouble swallowing.   Respiratory: Negative for chest tightness and shortness of breath.   Gastrointestinal: Negative for abdominal pain.  Neurological: Positive for headaches.    Allergies  Lisinopril  Home Medications   Current Outpatient Rx  Name Route Sig Dispense Refill  . CLOPIDOGREL BISULFATE 75 MG PO TABS Oral Take 75 mg by mouth daily.      Marland Kitchen DICLOFENAC SODIUM 50 MG PO TBEC Oral Take 50 mg by mouth daily.     Marland Kitchen LOSARTAN POTASSIUM 100 MG PO TABS Oral Take 100 mg by mouth daily.      Marland Kitchen METOPROLOL TARTRATE 25 MG PO TABS Oral Take 25 mg by mouth 2 (two) times daily.      Marland Kitchen SIMVASTATIN 40 MG PO TABS Oral Take 40 mg by mouth at bedtime.      . ALPRAZOLAM 0.25 MG PO  TABS Oral Take 0.25 mg by mouth daily as needed. For when nervous     . ASPIRIN EC 81 MG PO TBEC Oral Take 81 mg by mouth daily.        BP 100/65  Pulse 81  Temp(Src) 99.1 F (37.3 C) (Oral)  Resp 20  SpO2 100%  Physical Exam  Nursing note and vitals reviewed. Constitutional: She is oriented to person, place, and time. She appears distressed.  HENT:  Head: Normocephalic.  Mouth/Throat: Tonsillar abscesses present.    Neck: Trachea normal and normal range of motion. Neck supple. No mass and no thyromegaly present.  Pulmonary/Chest: Effort normal.  Lymphadenopathy:    She has cervical adenopathy.  Neurological: She is alert and oriented to person, place, and time.    ED Course  Procedures (including critical care time)   Labs Reviewed  POCT RAPID STREP A ([facility] CARE ONLY)   Ct Soft Tissue Neck W Contrast  09/04/2011  *RADIOLOGY REPORT*  Clinical Data: Progressive sore throat.  History of lupus.  CT NECK WITH CONTRAST  Technique:  Multidetector CT imaging of the neck was performed with intravenous contrast.  Contrast: 75mL OMNIPAQUE IOHEXOL 300 MG/ML IV SOLN  Comparison: None.  Findings: Diffuse inflammation of the palatine tonsils much more notable  on the right where there is an abscess collection throughout the right palatine tonsil measuring 1.3 x 1.1 cm maximal transverse dimension and spanning over 4.5 cm in length.  This is narrowing the airway with inflammation extending into the right posterior-superior nasopharyngeal region, into the soft palate and right aryepiglottic fold region with mild thickening of the epiglottis.  Mild breakthrough of inflammatory process into the right parapharyngeal space with infiltration of fat planes which extend in a retropharyngeal position of but without deep drainable abscess.  Adenopathy most notable right level II region measuring 1.5 x 1.1 cm maximal transverse dimension spanning over 2.1 cm in length.  No evidence of septic  thrombophlebitis of the right internal jugular vein.  Lucency along the upper right pre molars suggesting loosening with mild thickening of the maxillary sinuses.  Infection not excluded.  Cervical spondylotic changes most prominent C6-7.  Left apical lung parenchymal changes may represent scarring without associated bony destruction.  Stability can be confirmed on follow- up.  IMPRESSION: Abscess collection right palatine tonsil as detailed above.  Adenopathy most notable right level II region measuring 1.5 x 1.1 cm maximal transverse dimension spanning over 2.1 cm in length.  Lucency along the upper right pre molars suggesting loosening with mild thickening of the maxillary sinuses.  Infection not excluded.  Critical Value/emergent results were called by telephone at the time of interpretation on 09/04/2011  at 12:30 p.m.  to  Dr. Nino Parsley, who verbally acknowledged these results.  Original Report Authenticated By: Fuller Canada, M.D.     1. Peritonsillar abscess       MDM  R peritonsillar abscess- contacted Dr.Teoh alerted him of transfered to the ED- for surgical intervention        Jimmie Molly, MD 09/04/11 2119

## 2011-09-04 NOTE — ED Provider Notes (Signed)
I saw and evaluated the patient, reviewed the resident's note and I agree with the findings and plan.  Nicholes Stairs, MD 09/04/11 1622

## 2011-09-04 NOTE — ED Notes (Signed)
Hist of Lupus; ST for 1 week, no relief w OTC meds; praying for " Jesus to take me"

## 2011-09-04 NOTE — ED Notes (Signed)
MD at bedside. 

## 2011-09-04 NOTE — ED Provider Notes (Signed)
History     CSN: [id number] Arrival date & time: 09/04/2011 10:41 AM   First MD Initiated Contact with Patient 09/04/11 1057      Chief Complaint  Patient presents with  . Sore Throat  . Abscess   HPI Pt is a 50 year old female who presents with one week of progressively worsening throat pain.  Over the past three days she has developed significant pain with swallowing and went to an urgent care today where she was diagnosed with a peritonsillar abscess.  At this time she complains of pain with opening her mouth wide, pain in her right neck radiating to her right ear, and pain with swallowing.  No fevers/chills, nasal discharge, tooth pain, cough, nausea/vomiting.  She has had decreased oral intake secondary to pain.  Past Medical History  Diagnosis Date  . Hypertension   . Coronary artery disease   . Lupus     Past Surgical History  Procedure Date  . Mastectomy     History reviewed. No pertinent family history.  History  Substance Use Topics  . Smoking status: Current Everyday Smoker -- 0.5 packs/day  . Smokeless tobacco: Not on file  . Alcohol Use: Yes    OB History    Grav Para Term Preterm Abortions TAB SAB Ect Mult Living                  Review of Systems  Constitutional: Negative.   HENT: Positive for ear pain, sore throat, drooling, trouble swallowing and neck pain. Negative for hearing loss, congestion, facial swelling, rhinorrhea, sneezing, neck stiffness, dental problem, voice change, postnasal drip and sinus pressure.   Eyes: Negative.   Respiratory: Negative.   Cardiovascular: Negative.   Gastrointestinal: Negative.   Genitourinary: Negative.   Musculoskeletal: Negative for back pain and arthralgias.  Skin: Negative.   Neurological: Negative.   Hematological: Negative.     Allergies  Lisinopril  Home Medications   Current Outpatient Rx  Name Route Sig Dispense Refill  . ALPRAZOLAM 0.25 MG PO TABS Oral Take 0.25 mg by mouth daily as needed.  For when nervous     . ASPIRIN EC 81 MG PO TBEC Oral Take 81 mg by mouth daily.      Marland Kitchen CLOPIDOGREL BISULFATE 75 MG PO TABS Oral Take 75 mg by mouth daily.      Marland Kitchen DICLOFENAC SODIUM 50 MG PO TBEC Oral Take 50 mg by mouth daily.     Marland Kitchen LOSARTAN POTASSIUM 100 MG PO TABS Oral Take 100 mg by mouth daily.      Marland Kitchen METOPROLOL TARTRATE 25 MG PO TABS Oral Take 25 mg by mouth 2 (two) times daily.      Marland Kitchen SIMVASTATIN 40 MG PO TABS Oral Take 40 mg by mouth at bedtime.        BP 137/122  Pulse 75  Temp(Src) 97.1 F (36.2 C) (Oral)  Resp 16  SpO2 100%  Physical Exam  Constitutional: She is oriented to person, place, and time. She appears well-developed and well-nourished.  HENT:       No visible neck swelling, no tenderness to palpation, no skin erythema.  Unable to open jaw completely. There is significant swelling and erythema of the right side of soft palate.  Unable to see uvula due to edema and inability to open jaw completely.  Soft palate rises asymmetrically.  Tonsils are unable to be visualized.  No dental lesions appreciated.  Gums are normal in appearance.  Eyes: Conjunctivae  and EOM are normal.  Neck: Normal range of motion. Neck supple.  Cardiovascular: Normal rate, regular rhythm and normal heart sounds.   Pulmonary/Chest: Effort normal and breath sounds normal.  Abdominal: Soft. Bowel sounds are normal.  Musculoskeletal: Normal range of motion. She exhibits no edema.  Lymphadenopathy:    She has no cervical adenopathy.  Neurological: She is alert and oriented to person, place, and time.  Skin: Skin is warm and dry.    ED Course  Procedures (including critical care time)   Labs Reviewed  BASIC METABOLIC PANEL   No results found.   No diagnosis found.    MDM  Peritonsillar abscess on CT.  Have contacted ENT.  They will be by to drain later this PM.  Have requested pt to receive 900mg  IV clinda and 20mg  IV decadron plus pain control until they arrive.   Signed out to  oncoming team.       Majel Homer, MD 09/04/11 [phone]

## 2011-09-04 NOTE — ED Notes (Signed)
Pt. Medicated per med order. Airway intac no sob. pt. Is spitting excess saliva.

## 2011-09-04 NOTE — ED Notes (Signed)
Dr Suszanne Conners at bedside

## 2011-11-22 ENCOUNTER — Emergency Department (HOSPITAL_COMMUNITY)
Admission: EM | Admit: 2011-11-22 | Discharge: 2011-11-22 | Disposition: A | Discharge status: Home Health | Payer: PRIVATE HEALTH INSURANCE | Attending: Emergency Medicine | Admitting: Emergency Medicine

## 2011-11-22 ENCOUNTER — Emergency Department (HOSPITAL_COMMUNITY): Payer: PRIVATE HEALTH INSURANCE

## 2011-11-22 ENCOUNTER — Other Ambulatory Visit: Payer: Self-pay

## 2011-11-22 ENCOUNTER — Encounter (HOSPITAL_COMMUNITY): Payer: Self-pay | Admitting: Emergency Medicine

## 2011-11-22 DIAGNOSIS — R112 Nausea with vomiting, unspecified: Secondary | ICD-10-CM | POA: Insufficient documentation

## 2011-11-22 DIAGNOSIS — R0602 Shortness of breath: Secondary | ICD-10-CM | POA: Insufficient documentation

## 2011-11-22 DIAGNOSIS — R109 Unspecified abdominal pain: Secondary | ICD-10-CM | POA: Insufficient documentation

## 2011-11-22 DIAGNOSIS — I10 Essential (primary) hypertension: Secondary | ICD-10-CM | POA: Insufficient documentation

## 2011-11-22 DIAGNOSIS — Z79899 Other long term (current) drug therapy: Secondary | ICD-10-CM | POA: Insufficient documentation

## 2011-11-22 DIAGNOSIS — E860 Dehydration: Secondary | ICD-10-CM

## 2011-11-22 DIAGNOSIS — I2510 Atherosclerotic heart disease of native coronary artery without angina pectoris: Secondary | ICD-10-CM | POA: Insufficient documentation

## 2011-11-22 DIAGNOSIS — R231 Pallor: Secondary | ICD-10-CM | POA: Insufficient documentation

## 2011-11-22 DIAGNOSIS — R197 Diarrhea, unspecified: Secondary | ICD-10-CM | POA: Insufficient documentation

## 2011-11-22 DIAGNOSIS — R10819 Abdominal tenderness, unspecified site: Secondary | ICD-10-CM | POA: Insufficient documentation

## 2011-11-22 DIAGNOSIS — Z7982 Long term (current) use of aspirin: Secondary | ICD-10-CM | POA: Insufficient documentation

## 2011-11-22 DIAGNOSIS — F17200 Nicotine dependence, unspecified, uncomplicated: Secondary | ICD-10-CM | POA: Insufficient documentation

## 2011-11-22 DIAGNOSIS — M329 Systemic lupus erythematosus, unspecified: Secondary | ICD-10-CM | POA: Insufficient documentation

## 2011-11-22 LAB — DIFFERENTIAL
Eosinophils Absolute: 0 10*3/uL (ref 0.0–0.7)
Eosinophils Relative: 0 % (ref 0–5)
Lymphocytes Relative: 2 % — ABNORMAL LOW (ref 12–46)
Lymphs Abs: 0.4 10*3/uL — ABNORMAL LOW (ref 0.7–4.0)
Monocytes Absolute: 0.4 10*3/uL (ref 0.1–1.0)
Monocytes Relative: 2 % — ABNORMAL LOW (ref 3–12)

## 2011-11-22 LAB — GLUCOSE, CAPILLARY: Glucose-Capillary: 106 mg/dL — ABNORMAL HIGH (ref 70–99)

## 2011-11-22 LAB — COMPREHENSIVE METABOLIC PANEL
BUN: 20 mg/dL (ref 6–23)
CO2: 22 mEq/L (ref 19–32)
Calcium: 10.2 mg/dL (ref 8.4–10.5)
Creatinine, Ser: 0.68 mg/dL (ref 0.50–1.10)
GFR calc Af Amer: >90 mL/min (ref >90)
GFR calc non Af Amer: >90 mL/min (ref >90)
Glucose, Bld: 113 mg/dL — ABNORMAL HIGH (ref 70–99)
Total Protein: 9.6 g/dL — ABNORMAL HIGH (ref 6.0–8.3)

## 2011-11-22 LAB — CBC
HCT: 37.3 % (ref 36.0–46.0)
MCH: 31.6 pg (ref 26.0–34.0)
MCV: 90.1 fL (ref 78.0–100.0)
Platelets: 328 10*3/uL (ref 150–400)
RBC: 4.14 MIL/uL (ref 3.87–5.11)

## 2011-11-22 LAB — POCT I-STAT TROPONIN I: Troponin i, poc: 0 ng/mL (ref 0.00–0.08)

## 2011-11-22 MED ORDER — SODIUM CHLORIDE 0.9 % IV BOLUS (SEPSIS)
1000.0000 mL | Freq: Once | INTRAVENOUS | Status: AC
  Administered 2011-11-22: 1000 mL via INTRAVENOUS

## 2011-11-22 MED ORDER — ONDANSETRON HCL 4 MG/2ML IJ SOLN
4.0000 mg | Freq: Once | INTRAMUSCULAR | Status: AC
  Administered 2011-11-22: 4 mg via INTRAVENOUS
  Filled 2011-11-22: qty 2

## 2011-11-22 MED ORDER — PROMETHAZINE HCL 25 MG/ML IJ SOLN
12.5000 mg | INTRAMUSCULAR | Status: AC
  Administered 2011-11-22: 12.5 mg via INTRAVENOUS
  Filled 2011-11-22: qty 1

## 2011-11-22 MED ORDER — PROMETHAZINE HCL 25 MG RE SUPP: 25.0000 mg | Freq: Four times a day (QID) | RECTAL | Status: AC | PRN

## 2011-11-22 MED ORDER — METOCLOPRAMIDE HCL 5 MG/ML IJ SOLN
10.0000 mg | Freq: Once | INTRAMUSCULAR | Status: AC
  Administered 2011-11-22: 10 mg via INTRAVENOUS
  Filled 2011-11-22: qty 2

## 2011-11-22 MED ORDER — DIPHENHYDRAMINE HCL 50 MG/ML IJ SOLN
25.0000 mg | Freq: Once | INTRAMUSCULAR | Status: AC
  Administered 2011-11-22: 25 mg via INTRAVENOUS
  Filled 2011-11-22: qty 1

## 2011-11-22 MED ORDER — PROMETHAZINE HCL 25 MG/ML IJ SOLN
12.5000 mg | INTRAMUSCULAR | Status: DC
  Filled 2011-11-22: qty 1

## 2011-11-22 MED ORDER — SODIUM CHLORIDE 0.9 % IV SOLN
INTRAVENOUS | Status: DC
  Administered 2011-11-22: 10:00:00 via INTRAVENOUS

## 2011-11-22 NOTE — ED Notes (Signed)
Patient c/o nausea, vomiting and diarrhea x 2 days. No chest pain or sob. Transported from home by EMS. EMS unable to obtain CBG d/t pt refusal.

## 2011-11-22 NOTE — ED Provider Notes (Signed)
History     CSN: [id number]  Arrival date & time 11/22/11  4098   First MD Initiated Contact with Patient 11/22/11 0700      Chief Complaint  Patient presents with  . Nausea  . Emesis  . Diarrhea    (Consider location/radiation/quality/duration/timing/severity/associated sxs/prior treatment) HPI  Patient relates yesterday she started having nausea and vomiting over 20 times with diarrhea about the same. She has diffuse abdominal pain. She states she's been sweating but not having fever. She states she's had this before when she's had "stomach bug". She relates she was around another family member who was having vomiting. She states she feels weak. She states she started having chest pain a few hours ago that is in the central portion of her chest and is aching with shortness of breath. She states that it comes off and on and lasts about an hour. She states is different from when she had her heart attack several years ago which she states "was excruciating". Patient also describes some polyuria and polydipsia over the past week. She presents via EMS who state patient refused to have them do a CBG which patient states she did refuse to have anything done.  ECP Dr. August Luz  Past Medical History  Diagnosis Date  . Hypertension   . Coronary artery disease   . Lupus     Past Surgical History  Procedure Date  . Mastectomy    cardiac stent  History reviewed. No pertinent family history.  History  Substance Use Topics  . Smoking status: Current Everyday Smoker -- 0.5 packs/day  . Smokeless tobacco: Not on file  . Alcohol Use: Yes   on disability for lupus and MI Lives with son  OB History    Grav Para Term Preterm Abortions TAB SAB Ect Mult Living                  Review of Systems  All other systems reviewed and are negative.    Allergies  Review of patient's allergies indicates no active allergies.  Home Medications   Current Outpatient Rx  Name Route Sig Dispense  Refill  . ALPRAZOLAM 0.25 MG PO TABS Oral Take 0.25 mg by mouth daily as needed. For when nervous     . ASPIRIN EC 81 MG PO TBEC Oral Take 81 mg by mouth daily.      Marland Kitchen CLOPIDOGREL BISULFATE 75 MG PO TABS Oral Take 75 mg by mouth daily.      Marland Kitchen DICLOFENAC SODIUM 50 MG PO TBEC Oral Take 50 mg by mouth daily.     Marland Kitchen LOSARTAN POTASSIUM 100 MG PO TABS Oral Take 100 mg by mouth daily.      Marland Kitchen METOPROLOL TARTRATE 100 MG PO TABS Oral Take 100 mg by mouth 2 (two) times daily.    Marland Kitchen METOPROLOL TARTRATE 25 MG PO TABS Oral Take 25 mg by mouth 2 (two) times daily.      Marland Kitchen SIMVASTATIN 40 MG PO TABS Oral Take 40 mg by mouth at bedtime.      Marland Kitchen VITAMIN B-12 500 MCG PO TABS Oral Take 500 mcg by mouth daily.      BP 171/87  Pulse 56  Temp(Src) 97.7 F (36.5 C) (Oral)  Resp 24  Ht 5\' 7"  (1.702 m)  Wt 120 lb (54.432 kg)  BMI 18.79 kg/m2  SpO2 99%  Vital signs normal except hypertension   Physical Exam  Nursing note and vitals reviewed. Constitutional: She is oriented to  person, place, and time. She appears well-developed and well-nourished.  Non-toxic appearance. She does not appear ill. She appears distressed.       Patient curled up with the covers over her head  HENT:  Head: Normocephalic and atraumatic.  Right Ear: External ear normal.  Left Ear: External ear normal.  Nose: Nose normal. No mucosal edema or rhinorrhea.  Mouth/Throat: Mucous membranes are normal. No dental abscesses or uvula swelling.       Mucous membranes dry  Eyes: Conjunctivae and EOM are normal. Pupils are equal, round, and reactive to light.  Neck: Normal range of motion and full passive range of motion without pain. Neck supple.  Cardiovascular: Normal rate, regular rhythm and normal heart sounds.  Exam reveals no gallop and no friction rub.   No murmur heard. Pulmonary/Chest: Effort normal and breath sounds normal. No respiratory distress. She has no wheezes. She has no rhonchi. She has no rales. She exhibits no tenderness  and no crepitus.  Abdominal: Soft. Normal appearance and bowel sounds are normal. She exhibits no distension. There is tenderness. There is no rebound and no guarding.       Mild diffuse tenderness without localization  Musculoskeletal: Normal range of motion. She exhibits no edema and no tenderness.       Moves all extremities well.   Neurological: She is alert and oriented to person, place, and time. She has normal strength. No cranial nerve deficit.  Skin: Skin is warm, dry and intact. No rash noted. No erythema. There is pallor.  Psychiatric: Her speech is normal and behavior is normal. Her mood appears not anxious.       Affect flat    ED Course  Procedures (including critical care time)   Medications  0.9 %  sodium chloride infusion (  Intravenous New Bag/Given 11/22/11 0938)  promethazine (PHENERGAN) injection 12.5 mg (not administered)  sodium chloride 0.9 % bolus 1,000 mL (1000 mL Intravenous Given 11/22/11 0750)  ondansetron (ZOFRAN) injection 4 mg (4 mg Intravenous Given 11/22/11 0750)  metoCLOPramide (REGLAN) injection 10 mg (10 mg Intravenous Given 11/22/11 0751)  diphenhydrAMINE (BENADRYL) injection 25 mg (25 mg Intravenous Given 11/22/11 0751)  promethazine (PHENERGAN) injection 12.5 mg (12.5 mg Intravenous Given 11/22/11 0937)  promethazine (PHENERGAN) injection 12.5 mg (12.5 mg Intravenous Given 11/22/11 1046)   Patient was rechecked couple times. The last time at 12:30 she is lying comfortably in bed. She is no longer covered up in a blanket in the fetal position. She states she's willing to try oral fluids. She states she gets this a lot and has "GI bug". Pt did not want a CT scan of her abdomen/pelvis when I mentioned it. Pt had CT AP done last summer that was normal.    14:24 pt has drank fluids without problems. Feels ready to go home.    Results for orders placed during the hospital encounter of 11/22/11  CBC      Component Value Range   WBC 17.7 (*) 4.0 - 10.5 (K/uL)    RBC 4.14  3.87 - 5.11 (MIL/uL)   Hemoglobin 13.1  12.0 - 15.0 (g/dL)   HCT 47.8  29.5 - 62.1 (%)   MCV 90.1  78.0 - 100.0 (fL)   MCH 31.6  26.0 - 34.0 (pg)   MCHC 35.1  30.0 - 36.0 (g/dL)   RDW 30.8  65.7 - 84.6 (%)   Platelets 328  150 - 400 (K/uL)  DIFFERENTIAL      Component Value  Range   Neutrophils Relative 95 (*) 43 - 77 (%)   Neutro Abs 16.9 (*) 1.7 - 7.7 (K/uL)   Lymphocytes Relative 2 (*) 12 - 46 (%)   Lymphs Abs 0.4 (*) 0.7 - 4.0 (K/uL)   Monocytes Relative 2 (*) 3 - 12 (%)   Monocytes Absolute 0.4  0.1 - 1.0 (K/uL)   Eosinophils Relative 0  0 - 5 (%)   Eosinophils Absolute 0.0  0.0 - 0.7 (K/uL)   Basophils Relative 0  0 - 1 (%)   Basophils Absolute 0.0  0.0 - 0.1 (K/uL)  COMPREHENSIVE METABOLIC PANEL      Component Value Range   Sodium 146 (*) 135 - 145 (mEq/L)   Potassium 3.8  3.5 - 5.1 (mEq/L)   Chloride 108  96 - 112 (mEq/L)   CO2 22  19 - 32 (mEq/L)   Glucose, Bld 113 (*) 70 - 99 (mg/dL)   BUN 20  6 - 23 (mg/dL)   Creatinine, Ser 1.61  0.50 - 1.10 (mg/dL)   Calcium 09.6  8.4 - 10.5 (mg/dL)   Total Protein 9.6 (*) 6.0 - 8.3 (g/dL)   Albumin 4.9  3.5 - 5.2 (g/dL)   AST 23  0 - 37 (U/L)   ALT 11  0 - 35 (U/L)   Alkaline Phosphatase 72  39 - 117 (U/L)   Total Bilirubin 0.3  0.3 - 1.2 (mg/dL)   GFR calc non Af Amer >90  >90 (mL/min)   GFR calc Af Amer >90  >90 (mL/min)  POCT I-STAT TROPONIN I      Component Value Range   Troponin i, poc 0.00  0.00 - 0.08 (ng/mL)   Comment 3           GLUCOSE, CAPILLARY      Component Value Range   Glucose-Capillary 106 (*) 70 - 99 (mg/dL)   Laboratory interpretation all normal except leukocytosis consistent with vomiting  Dg Abd Acute W/chest  11/22/2011  *RADIOLOGY REPORT*  Clinical Data: Abdominal pain with nausea vomiting and diarrhea  ACUTE ABDOMEN SERIES (ABDOMEN 2 VIEW & CHEST 1 VIEW)  Comparison: 03/18/2011  Findings: Heart size is normal.  Normal vascularity.  Question nodular density overlying the right upper hilum.   This could represent pulmonary vascularity and is similar to the prior study. Negative for pneumonia.  Normal bowel gas pattern.  Negative for bowel obstruction or free air in the abdomen.  No acute bony abnormality.  No renal calculi.  IMPRESSION: No acute cardiopulmonary disease.  Right hilar density is unchanged and may represent prominent vascularity or a granuloma.  Nonobstructive bowel gas pattern.  Original Report Authenticated By: Camelia Phenes, M.D.     Date: 11/22/2011  Rate: 65  Rhythm: normal sinus rhythm  QRS Axis: normal  Intervals: QT prolonged  ST/T Wave abnormalities: nonspecific T wave changes  Conduction Disutrbances:none  Narrative Interpretation:   Old EKG Reviewed: unchanged from 03/18/2011     1. Nausea vomiting and diarrhea   2. Dehydration    New Prescriptions   PROMETHAZINE (PHENERGAN) 25 MG SUPPOSITORY    Place 1 suppository (25 mg total) rectally every 6 (six) hours as needed for nausea.   Plan discharge Devoria Albe, MD, FACEP    MDM          Ward Givens, MD 11/22/11 1425

## 2011-11-22 NOTE — Discharge Instructions (Signed)
Drink plenty of fluids (clear liquids) the next 12-24 hours then start the BRAT diet. . Use the phenergan for nausea or vomiting. Take imodium OTC for diarrhea. Avoid mild products until the diarrhea is gone. Recheck if you get worse.  

## 2012-08-03 ENCOUNTER — Other Ambulatory Visit: Payer: Self-pay | Admitting: Internal Medicine

## 2012-08-03 DIAGNOSIS — Z1231 Encounter for screening mammogram for malignant neoplasm of breast: Secondary | ICD-10-CM

## 2012-09-11 ENCOUNTER — Ambulatory Visit: Payer: PRIVATE HEALTH INSURANCE

## 2012-10-09 ENCOUNTER — Ambulatory Visit: Payer: PRIVATE HEALTH INSURANCE

## 2013-02-22 ENCOUNTER — Other Ambulatory Visit: Payer: Self-pay | Admitting: Internal Medicine

## 2013-02-22 DIAGNOSIS — Z1231 Encounter for screening mammogram for malignant neoplasm of breast: Secondary | ICD-10-CM

## 2013-02-24 ENCOUNTER — Encounter: Payer: Self-pay | Admitting: Gastroenterology

## 2013-03-25 ENCOUNTER — Encounter: Payer: Self-pay | Admitting: Physician Assistant

## 2013-03-26 ENCOUNTER — Ambulatory Visit: Payer: PRIVATE HEALTH INSURANCE

## 2013-04-01 ENCOUNTER — Encounter: Payer: Self-pay | Admitting: Physician Assistant

## 2013-04-01 ENCOUNTER — Other Ambulatory Visit: Payer: Self-pay | Admitting: *Deleted

## 2013-04-01 ENCOUNTER — Ambulatory Visit (INDEPENDENT_AMBULATORY_CARE_PROVIDER_SITE_OTHER): Payer: PRIVATE HEALTH INSURANCE | Admitting: Physician Assistant

## 2013-04-01 VITALS — BP 100/60 | HR 67 | Ht 67.0 in | Wt 122.2 lb

## 2013-04-01 DIAGNOSIS — R1310 Dysphagia, unspecified: Secondary | ICD-10-CM

## 2013-04-01 DIAGNOSIS — Z7901 Long term (current) use of anticoagulants: Secondary | ICD-10-CM

## 2013-04-01 DIAGNOSIS — Z1211 Encounter for screening for malignant neoplasm of colon: Secondary | ICD-10-CM

## 2013-04-01 MED ORDER — MOVIPREP 100 G PO SOLR: ORAL | Status: DC

## 2013-04-01 NOTE — Progress Notes (Signed)
Subjective:    Patient ID: [name], female    DOB: 07-14-1961, 52 y.o.   MRN: [id number]  HPI [name] is a pleasant 52 year old African American female new to GI today and referred for screening colonoscopy She was originally sent in for a directcolon,  However she is on Plavix and therefore office visit was scheduled. Patient has history of coronary artery disease she status post MI for 52 years ago and says she had a coronary stent placed at that time. She also has history of hypertension, hyperlipidemia, and Lupus. She has been on Plavix since her MI and says that she has not had any more recent events. She had not been seen by a cardiologist for several years and just recently saw Dr. Charleston Poot  she with [facility] cardiology and says that she scheduled for stress test next week. Has not had any prior colonoscopy. She does feel that she has history of IBS and has had problems with intermittent gas and urgency postprandially for years. She also mentions occasional heartburn and intermittent solid food dysphagia which she says does not occur on a daily or necessarily even a weekly basis. She says that frequently solid food will become lodged to talk to stop eating and wait for her to go down. She has not had any regurgitation. Family history is pertinent for some sort of abdominal cancer in her mother and a sister with breast and oral pharyngeal cancer    Review of Systems  Constitutional: Negative.   HENT: Positive for trouble swallowing.   Eyes: Negative.   Respiratory: Negative.   Cardiovascular: Negative.   Gastrointestinal: Negative.   Endocrine: Negative.   Genitourinary: Negative.   Musculoskeletal: Negative.   Skin: Positive for rash.  Allergic/Immunologic: Negative.   Neurological: Negative.   Hematological: Negative.   Psychiatric/Behavioral: Negative.    Outpatient Prescriptions Prior to Visit  Medication Sig Dispense Refill  . ALPRAZolam (XANAX) 0.25 MG tablet Take 0.25  mg by mouth daily as needed. For when nervous       . aspirin EC 81 MG tablet Take 81 mg by mouth daily.        . clopidogrel (PLAVIX) 75 MG tablet Take 75 mg by mouth daily.        . diclofenac (VOLTAREN) 50 MG EC tablet Take 50 mg by mouth daily.       Marland Kitchen losartan (COZAAR) 100 MG tablet Take 100 mg by mouth daily.        . metoprolol (LOPRESSOR) 100 MG tablet Take 100 mg by mouth 2 (two) times daily.      . metoprolol tartrate (LOPRESSOR) 25 MG tablet Take 25 mg by mouth 2 (two) times daily.        . simvastatin (ZOCOR) 40 MG tablet Take 40 mg by mouth at bedtime.        . vitamin B-12 (CYANOCOBALAMIN) 500 MCG tablet Take 500 mcg by mouth daily.       No facility-administered medications prior to visit.   No Known Allergies Patient Active Problem List   Diagnosis Date Noted  . INSOMNIA, CHRONIC 02/16/2009  . LEUKOCYTOSIS UNSPECIFIED 11/22/2008  . MYOCARDIAL INFARCTION, HX OF 07/04/2008  . 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 TOBACCO ABUSE 03/03/2008  . OTHER SPECIFIED DISORDERS OF LIVER 03/03/2008  . BACK PAIN, CHRONIC 01/07/2007  . FREQUENCY, URINARY 01/07/2007  . 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 MYOPATHY 09/01/2006  . ESSENTIAL HYPERTENSION 09/01/2006  . GERD 09/01/2006  . MENOPAUSAL SYNDROME 09/01/2006  .  SYSTEMIC LUPUS ERYTHEMATOSUS 09/01/2006  . LOW BACK PAIN 09/01/2006  . Unspecified Myalgia and Myositis 09/01/2006  . OSTEOPENIA 09/01/2006   History  Substance Use Topics  . Smoking status: Current Every Day Smoker -- 0.50 packs/day  . Smokeless tobacco: Never Used  . Alcohol Use: Yes   family history includes Breast cancer in an unspecified family member; Heart disease in an unspecified family member; and Stomach cancer in an unspecified family member.     Objective:   Physical Exam  well-developed African American female in no acute distress, pleasant blood pressure 100/60 pulse 67 height 5 foot 7 weight 122. HEENT; nontraumatic normocephalic  EOMI PERRLA sclera anicteric she has erythema and skin changes of her nose and cheeks consistent with history of lupus, Supple ;no JVD, Cardiovascula;r regular rate and rhythm with S1-S2 no murmur or gallop, Pulmonary; clear bilaterally, Abdomen; soft nontender nondistended bowel sounds are active there is no palpable mass or hepatosplenomegaly, Rectal; exam not done, Extremities; no clubbing cyanosis or edema skin warm and dry, Psych; mood and affect normal and appropriate        Assessment & Plan:  #70 52 year old female seen for colon neoplasia surveillance #2 intermittent solid food dysphagia rule out stricture #3 chronic antiplatelet therapy on Plavix and aspirin #4 history of coronary artery disease status post MI 52 years ago with stent #5 hypertension #6 lupus #7 hyperlipidemia  Plan; patient will benefit from colonoscopy and upper endoscopy with possible esophageal dilation. These will be scheduled with Dr. Jarold Motto however would like her to be cleared from a cardiac standpoint prior to scheduling, and therefore will reschedule for August. She has stress test scheduled next week and will wait for the results of this. Will obtain consent from Dr. Charleston Poot  for her to come off of Plavix for 5 days prior to her procedures. Procedures and risk benefit of stopping Plavix were all discussed with the patient in detail and she is agreeable to proceed.

## 2013-04-01 NOTE — Patient Instructions (Addendum)
You have been scheduled for an endoscopy and colonoscopy with propofol. Please follow the written instructions given to you at your visit today. Please pick up your prep at the pharmacy within the next 1-3 days. [facility] [address].  If you use inhalers (even only as needed), please bring them with you on the day of your procedure. Your physician has requested that you go to www.startemmi.com and enter the access code given to you at your visit today. This web site gives a general overview about your procedure. However, you should still follow specific instructions given to you by our office regarding your preparation for the procedure.

## 2013-04-09 ENCOUNTER — Encounter: Payer: PRIVATE HEALTH INSURANCE | Admitting: Gastroenterology

## 2013-04-22 ENCOUNTER — Ambulatory Visit: Payer: PRIVATE HEALTH INSURANCE

## 2013-04-26 ENCOUNTER — Telehealth: Payer: Self-pay | Admitting: *Deleted

## 2013-04-26 ENCOUNTER — Ambulatory Visit (AMBULATORY_SURGERY_CENTER): Payer: PRIVATE HEALTH INSURANCE | Admitting: Gastroenterology

## 2013-04-26 ENCOUNTER — Encounter: Payer: Self-pay | Admitting: Gastroenterology

## 2013-04-26 VITALS — BP 133/84 | HR 62 | Temp 97.6°F | Resp 21 | Ht 67.0 in | Wt 122.0 lb

## 2013-04-26 DIAGNOSIS — R1310 Dysphagia, unspecified: Secondary | ICD-10-CM

## 2013-04-26 DIAGNOSIS — K219 Gastro-esophageal reflux disease without esophagitis: Secondary | ICD-10-CM

## 2013-04-26 DIAGNOSIS — Z1211 Encounter for screening for malignant neoplasm of colon: Secondary | ICD-10-CM

## 2013-04-26 MED ORDER — SODIUM CHLORIDE 0.9 % IV SOLN: 500.0000 mL | INTRAVENOUS | Status: DC

## 2013-04-26 NOTE — Progress Notes (Signed)
Called to room to assist during endoscopic procedure.  Patient ID and intended procedure confirmed with present staff. Received instructions for my participation in the procedure from the performing physician.  

## 2013-04-26 NOTE — Op Note (Signed)
[facility] [address] [place], [address]   COLONOSCOPY PROCEDURE REPORT  PATIENT: [name], [name]  MR#: [id number] BIRTHDATE: 05-Aug-1961 , 51  yrs. old GENDER: Female ENDOSCOPIST: Mardella Layman, MD, [facility] REFERRED BY:Amy Esterwood, PA-C PROCEDURE DATE:  04/26/2013 PROCEDURE:   Colonoscopy, screening First Screening Colonoscopy - Avg.  risk and is 50 yrs.  old or older Yes.  Prior Negative Screening - Now for repeat screening. N/A  History of Adenoma - Now for follow-up colonoscopy & has been > or = to 3 yrs.  N/A ASA CLASS:   Class III INDICATIONS:Colorectal cancer screening. MEDICATIONS: Propofol (Diprivan) 240 (none) IV  DESCRIPTION OF PROCEDURE:   After the risks benefits and alternatives of the procedure were thoroughly explained, informed consent was obtained.  A digital rectal exam revealed no abnormalities of the rectum.   The [id number]  endoscope was introduced through the anus and advanced to the cecum, which was identified by both the appendix and ileocecal valve. No adverse events experienced.   The quality of the prep was excellent, using MoviPrep  The instrument was then slowly withdrawn as the colon was fully examined.      COLON FINDINGS: Moderate diverticulosis was noted in the descending colon and sigmoid colon.   The colon was otherwise normal.  There was no diverticulosis, inflammation, polyps or cancers unless previously stated.  Retroflexed views revealed no abnormalities. The time to cecum=6 minutes 160 seconds.  Withdrawal time=6 minutes 120 seconds.  The scope was withdrawn and the procedure completed. COMPLICATIONS: There were no complications.  ENDOSCOPIC IMPRESSION: 1.   Moderate diverticulosis was noted in the descending colon and sigmoid colon 2.   The colon was otherwise normal ..no polyps noted  RECOMMENDATIONS: 1.  Continue current medications 2.  High fiber diet with liberal fluid intake. 3.   Continue current colorectal screening recommendations for "routine risk" patients with a repeat colonoscopy in 10 years.   eSigned:  Mardella Layman, MD, [facility] 04/26/2013 2:51 PM   cc:

## 2013-04-26 NOTE — Patient Instructions (Signed)
YOU HAD AN ENDOSCOPIC PROCEDURE TODAY AT THE [facility]: Refer to the procedure report that was given to you for any specific questions about what was found during the examination.  If the procedure report does not answer your questions, please call your gastroenterologist to clarify.  If you requested that your care partner not be given the details of your procedure findings, then the procedure report has been included in a sealed envelope for you to review at your convenience later.  YOU SHOULD EXPECT: Some feelings of bloating in the abdomen. Passage of more gas than usual.  Walking can help get rid of the air that was put into your GI tract during the procedure and reduce the bloating. If you had a lower endoscopy (such as a colonoscopy or flexible sigmoidoscopy) you may notice spotting of blood in your stool or on the toilet paper. If you underwent a bowel prep for your procedure, then you may not have a normal bowel movement for a few days.  DIET: FOLLOW DILATATION DIET TODAY(GIVEN TO YOU)   ACTIVITY: Your care partner should take you home directly after the procedure.  You should plan to take it easy, moving slowly for the rest of the day.  You can resume normal activity the day after the procedure however you should NOT DRIVE or use heavy machinery for 24 hours (because of the sedation medicines used during the test).    SYMPTOMS TO REPORT IMMEDIATELY: A gastroenterologist can be reached at any hour.  During normal business hours, 8:30 AM to 5:00 PM Monday through Friday, call [phone].  After hours and on weekends, please call the GI answering service at [phone] who will take a message and have the physician on call contact you.   Following lower endoscopy (colonoscopy or flexible sigmoidoscopy):  Excessive amounts of blood in the stool  Significant tenderness or worsening of abdominal pains  Swelling of the abdomen that is new, acute  Fever of 100F or  higher  Following upper endoscopy (EGD)  Vomiting of blood or coffee ground material  New chest pain or pain under the shoulder blades  Painful or persistently difficult swallowing  New shortness of breath  Fever of 100F or higher  Black, tarry-looking stools  FOLLOW UP: If any biopsies were taken you will be contacted by phone or by letter within the next 1-3 weeks.  Call your gastroenterologist if you have not heard about the biopsies in 3 weeks.  Our staff will call the home number listed on your records the next business day following your procedure to check on you and address any questions or concerns that you may have at that time regarding the information given to you following your procedure. This is a courtesy call and so if there is no answer at the home number and we have not heard from you through the emergency physician on call, we will assume that you have returned to your regular daily activities without incident.  SIGNATURES/CONFIDENTIALITY: You and/or your care partner have signed paperwork which will be entered into your electronic medical record.  These signatures attest to the fact that that the information above on your After Visit Summary has been reviewed and is understood.  Full responsibility of the confidentiality of this discharge information lies with you and/or your care-partner.    INFORMATION ON DIVERTICULOSIS & HIGH FIBER DIET GIVEN TO YOU TODAY

## 2013-04-26 NOTE — Telephone Encounter (Signed)
LM for the patient to call me this morning if possible.  I LM asking if she got in touch with Dr. Maxwell Caul office regarding the Plavix medication. Her EGD is scheduled for today 04-26-2013 and it may need cancelled.  I faxed the Plavix clearance letter to Dr. Mikeal Hawthorne on 7-17 and 7-29.  I have called Dr. Maxwell Caul, [facility] and had to leave a message for Dr. Mikeal Hawthorne or his nurse to call me this AM if possible.

## 2013-04-26 NOTE — Progress Notes (Signed)
Patient did not experience any of the following events: a burn prior to discharge; a fall within the facility; wrong site/side/patient/procedure/implant event; or a hospital transfer or hospital admission upon discharge from the facility. (G8907) Patient did not have preoperative order for IV antibiotic SSI prophylaxis. (G8918)  

## 2013-04-26 NOTE — Telephone Encounter (Signed)
Called home number listed on the patient's demographics, [phone]. LM asking patient to call me asap today regarding the Procedures she is scheduled for today 04-26-2013 at 2:30PM.  Arrival time scheduled for 1:30 PM .

## 2013-04-26 NOTE — Op Note (Signed)
[facility] [address] [place], [address]   ENDOSCOPY PROCEDURE REPORT  PATIENT: [name], [name]  MR#: [id number] BIRTHDATE: October 14, 1960 , 51  yrs. old GENDER: Female ENDOSCOPIST:David Hale Bogus, MD, Clementeen Graham REFERRED BY: Mike Gip, PA-C PROCEDURE DATE:  04/26/2013 PROCEDURE:   EGD w/ biopsy for H.pylori and Maloney dilation of esophagus ASA CLASS:    Class III INDICATIONS: Dysphagia. MEDICATION: There was residual sedation effect present from prior procedure and propofol (Diprivan) 100mg  IV TOPICAL ANESTHETIC:  DESCRIPTION OF PROCEDURE:   After the risks and benefits of the procedure were explained, informed consent was obtained.  The LB [id number]  endoscope was introduced through the mouth  and advanced to the second portion of the duodenum .  The instrument was slowly withdrawn as the mucosa was fully examined.    The upper, middle and distal third of the esophagus were carefully inspected and no abnormalities were noted.  The z-line was well seen at the GEJ.  The endoscope was pushed into the fundus which was normal including a retroflexed view. CLO Bx donr  The antrum, gastric body, first and second part of the duodenum were unremarkable.    Retroflexed views revealed no abnormalities. The scope was then withdrawn from the patient and the procedure completed.he was then dilated with a #54 [place] dilator without difficulty.  There is no pain or bleeding noted.  COMPLICATIONS: There were no complications.   ENDOSCOPIC IMPRESSION: Normal EGD...pill dysphagia of ?? significence.  RECOMMENDATIONS: Continue current medications.Marland KitchenMarland KitchenCenter high-resolution esophageal manometry if symptoms persist    _______________________________ eSigned:  Mardella Layman, MD, [facility] 04/26/2013 2:56 PM   standard discharge

## 2013-04-26 NOTE — Progress Notes (Signed)
A/ox3 pleased with MAC, report to Penny RN 

## 2013-04-26 NOTE — Telephone Encounter (Signed)
Lm for patient to call the office back at home phone number [phone] and cell phone number [phone] Callled for [name], CMA about patient's clearance letter and procedure

## 2013-04-26 NOTE — Telephone Encounter (Signed)
I called and spoke to [place] at Dr. Maxwell Caul office at 11:30 am .  I told her I had faxed a Plavix clearance letter to them on 7-17 and 7-29 . I have not gotten a response.  Mechele Dawley said that the patient saw them on Thursday and said she stopped taking the Plavix because we told her to stop it.  I advised the patient we normally don't tell the patient that. Our process is that we send a anticoagulation letter to the MD that manages the anticoagulant and asked them to advise [place] if they want the pt to stop the medication or stay on the medication.  Mechele Dawley said the patient was adamet that she was not on the Plavix when she was there Thursday 04-22-2013.  I am not certain how long prior to Thursday she stopped taking the Plavix.

## 2013-04-26 NOTE — Telephone Encounter (Signed)
I did speak to [facility], at Dr. Maxwell Caul office.  I told her I have not received a response from Dr. Mikeal Hawthorne . I let her know I faxed the anticoagulation letter on 7-17-and 7-29.  I informed Dr. Vania Rea. Jarold Motto of this and let him know that I was told the patient saw Dr. Mikeal Hawthorne on Thursday 04-22-2013 and she stopped her Plavix . She told Dr. Mikeal Hawthorne that we told her to stop her Plavix.  Dr. Jarold Motto said he could do the procedures today.

## 2013-04-26 NOTE — Progress Notes (Signed)
Pt. States that her doctor took her off Plavix 2 weeks ago.

## 2013-04-27 ENCOUNTER — Telehealth: Payer: Self-pay | Admitting: *Deleted

## 2013-04-27 LAB — HELICOBACTER PYLORI SCREEN-BIOPSY: UREASE: POSITIVE

## 2013-04-27 NOTE — Telephone Encounter (Signed)
No answer left message to call if questions or concerns. 

## 2013-04-28 ENCOUNTER — Encounter: Payer: Self-pay | Admitting: Gastroenterology

## 2013-04-28 ENCOUNTER — Ambulatory Visit: Payer: PRIVATE HEALTH INSURANCE

## 2013-05-04 ENCOUNTER — Telehealth: Payer: Self-pay | Admitting: *Deleted

## 2013-05-04 NOTE — Telephone Encounter (Signed)
Letter from: Mardella Layman   Comments: 04/30/13 sent JLR   10 days prevpavc RX      lmom for pt to call back.

## 2013-05-05 ENCOUNTER — Telehealth: Payer: Self-pay | Admitting: *Deleted

## 2013-05-05 MED ORDER — AMOXICILL-CLARITHRO-LANSOPRAZ PO MISC: Freq: Two times a day (BID) | ORAL | Status: DC

## 2013-05-05 MED ORDER — ESOMEPRAZOLE MAGNESIUM 40 MG PO CPDR: 40.0000 mg | DELAYED_RELEASE_CAPSULE | Freq: Two times a day (BID) | ORAL | Status: DC

## 2013-05-05 MED ORDER — CLARITHROMYCIN 500 MG PO TABS: 500.0000 mg | ORAL_TABLET | Freq: Two times a day (BID) | ORAL | Status: DC

## 2013-05-05 MED ORDER — AMOXICILLIN 500 MG PO CAPS: 1000.0000 mg | ORAL_CAPSULE | Freq: Two times a day (BID) | ORAL | Status: DC

## 2013-05-05 NOTE — Telephone Encounter (Signed)
Patient's insurance will not cover Prevpac  Per Tylene Fantasia, PA ok to send:  Clarithromycin 500 mg two times daily for 14 days Amoxicillin 500 mg(take 1000 twice daily) for 14 days Nexium 40 mg twice daily for 14 days  I have Nexium samples for patient Called patient and left message for patient to call back

## 2013-05-05 NOTE — Telephone Encounter (Signed)
Informed pt of dx and the need for AB. Ordered Prevpak for pt and she is to call for problems; pt stated understanding.

## 2013-09-19 ENCOUNTER — Encounter (HOSPITAL_COMMUNITY): Payer: Self-pay | Admitting: Emergency Medicine

## 2013-09-19 ENCOUNTER — Emergency Department (HOSPITAL_COMMUNITY)
Admission: EM | Admit: 2013-09-19 | Discharge: 2013-09-19 | Discharge status: Against Medical Advice | Payer: PRIVATE HEALTH INSURANCE | Attending: Emergency Medicine | Admitting: Emergency Medicine

## 2013-09-19 DIAGNOSIS — M549 Dorsalgia, unspecified: Secondary | ICD-10-CM

## 2013-09-19 DIAGNOSIS — G8929 Other chronic pain: Secondary | ICD-10-CM | POA: Insufficient documentation

## 2013-09-19 DIAGNOSIS — I2510 Atherosclerotic heart disease of native coronary artery without angina pectoris: Secondary | ICD-10-CM | POA: Insufficient documentation

## 2013-09-19 DIAGNOSIS — I10 Essential (primary) hypertension: Secondary | ICD-10-CM | POA: Insufficient documentation

## 2013-09-19 DIAGNOSIS — M545 Low back pain, unspecified: Secondary | ICD-10-CM | POA: Insufficient documentation

## 2013-09-19 DIAGNOSIS — F17200 Nicotine dependence, unspecified, uncomplicated: Secondary | ICD-10-CM | POA: Insufficient documentation

## 2013-09-19 NOTE — ED Notes (Signed)
Pt. Left AMA before being seen by PA-C.  Pt. Demanding that PA-C see her and treat her pain. PA-was made aware of pts. Pain.

## 2013-09-19 NOTE — ED Notes (Signed)
Lower back pain - all day but chronic.  Pt. Takes vicodin but has run out of med. Muscle relaxer's do no good. Fibromyalgia, etc.

## 2013-09-19 NOTE — ED Provider Notes (Signed)
Patient left without being seen. Patient left after being triaged. Patient left prior to this provider assessing and examining the patient. This provider did not partake in any medical decision-making.   Jamse Mead, PA-C 09/19/13 [phone]

## 2013-09-20 NOTE — ED Provider Notes (Signed)
Medical screening examination/treatment/procedure(s) were performed by non-physician practitioner and as supervising physician I was immediately available for consultation/collaboration.  EKG Interpretation   None         Julianne Rice, MD 09/20/13 [phone]

## 2013-09-21 ENCOUNTER — Emergency Department (INDEPENDENT_AMBULATORY_CARE_PROVIDER_SITE_OTHER)
Admission: EM | Admit: 2013-09-21 | Discharge: 2013-09-21 | Disposition: A | Discharge status: Home / Self Care | Payer: PRIVATE HEALTH INSURANCE | Source: Home / Self Care | Attending: Family Medicine | Admitting: Family Medicine

## 2013-09-21 ENCOUNTER — Emergency Department (INDEPENDENT_AMBULATORY_CARE_PROVIDER_SITE_OTHER): Payer: PRIVATE HEALTH INSURANCE

## 2013-09-21 ENCOUNTER — Encounter (HOSPITAL_COMMUNITY): Payer: Self-pay | Admitting: Emergency Medicine

## 2013-09-21 DIAGNOSIS — M549 Dorsalgia, unspecified: Secondary | ICD-10-CM

## 2013-09-21 LAB — POCT URINALYSIS DIP (DEVICE)
Bilirubin Urine: NEGATIVE
GLUCOSE, UA: NEGATIVE mg/dL
Hgb urine dipstick: NEGATIVE
Ketones, ur: NEGATIVE mg/dL
Leukocytes, UA: NEGATIVE
NITRITE: NEGATIVE
Protein, ur: 30 mg/dL — AB
Specific Gravity, Urine: 1.025 (ref 1.005–1.030)
UROBILINOGEN UA: 0.2 mg/dL (ref 0.0–1.0)
pH: 6 (ref 5.0–8.0)

## 2013-09-21 LAB — POCT I-STAT, CHEM 8
BUN: 14 mg/dL (ref 6–23)
CHLORIDE: 103 meq/L (ref 96–112)
Calcium, Ion: 1.27 mmol/L — ABNORMAL HIGH (ref 1.12–1.23)
Creatinine, Ser: 0.8 mg/dL (ref 0.50–1.10)
GLUCOSE: 96 mg/dL (ref 70–99)
HCT: 41 % (ref 36.0–46.0)
Hemoglobin: 13.9 g/dL (ref 12.0–15.0)
POTASSIUM: 3.6 meq/L — AB (ref 3.7–5.3)
Sodium: 141 mEq/L (ref 137–147)
TCO2: 25 mmol/L (ref 0–100)

## 2013-09-21 MED ORDER — PREDNISONE 10 MG PO KIT: PACK | ORAL | Status: DC

## 2013-09-21 MED ORDER — ASPIRIN EC 81 MG PO TBEC: 81.0000 mg | DELAYED_RELEASE_TABLET | Freq: Every day | ORAL | Status: AC

## 2013-09-21 MED ORDER — HYDROCODONE-ACETAMINOPHEN 7.5-325 MG PO TABS: 1.0000 | ORAL_TABLET | Freq: Four times a day (QID) | ORAL | Status: DC | PRN

## 2013-09-21 NOTE — Discharge Instructions (Signed)
Back Exercises °Back exercises help treat and prevent back injuries. The goal of back exercises is to increase the strength of your abdominal and back muscles and the flexibility of your back. These exercises should be started when you no longer have back pain. Back exercises include: °· Pelvic Tilt. Lie on your back with your knees bent. Tilt your pelvis until the lower part of your back is against the floor. Hold this position 5 to 10 sec and repeat 5 to 10 times. °· Knee to Chest. Pull first 1 knee up against your chest and hold for 20 to 30 seconds, repeat this with the other knee, and then both knees. This may be done with the other leg straight or bent, whichever feels better. °· Sit-Ups or Curl-Ups. Bend your knees 90 degrees. Start with tilting your pelvis, and do a partial, slow sit-up, lifting your trunk only 30 to 45 degrees off the floor. Take at least 2 to 3 seconds for each sit-up. Do not do sit-ups with your knees out straight. If partial sit-ups are difficult, simply do the above but with only tightening your abdominal muscles and holding it as directed. °· Hip-Lift. Lie on your back with your knees flexed 90 degrees. Push down with your feet and shoulders as you raise your hips a couple inches off the floor; hold for 10 seconds, repeat 5 to 10 times. °· Back arches. Lie on your stomach, propping yourself up on bent elbows. Slowly press on your hands, causing an arch in your low back. Repeat 3 to 5 times. Any initial stiffness and discomfort should lessen with repetition over time. °· Shoulder-Lifts. Lie face down with arms beside your body. Keep hips and torso pressed to floor as you slowly lift your head and shoulders off the floor. °Do not overdo your exercises, especially in the beginning. Exercises may cause you some mild back discomfort which lasts for a few minutes; however, if the pain is more severe, or lasts for more than 15 minutes, do not continue exercises until you see your caregiver.  Improvement with exercise therapy for back problems is slow.  °See your caregivers for assistance with developing a proper back exercise program. °Document Released: 10/10/2004 Document Revised: 11/25/2011 Document Reviewed: 07/04/2011 °ExitCare® Patient Information ©2014 [employer]. ° °Back Pain, Adult °Low back pain is very common. About 1 in 5 people have back pain. The cause of low back pain is rarely dangerous. The pain often gets better over time. About half of people with a sudden onset of back pain feel better in just 2 weeks. About 8 in 10 people feel better by 6 weeks.  °CAUSES °Some common causes of back pain include: °· Strain of the muscles or ligaments supporting the spine. °· Wear and tear (degeneration) of the spinal discs. °· Arthritis. °· Direct injury to the back. °DIAGNOSIS °Most of the time, the direct cause of low back pain is not known. However, back pain can be treated effectively even when the exact cause of the pain is unknown. Answering your caregiver's questions about your overall health and symptoms is one of the most accurate ways to make sure the cause of your pain is not dangerous. If your caregiver needs more information, he or she may order lab work or imaging tests (X-rays or MRIs). However, even if imaging tests show changes in your back, this usually does not require surgery. °HOME CARE INSTRUCTIONS °For many people, back pain returns. Since low back pain is rarely dangerous, it is often a condition that people   can learn to manage on their own.  °· Remain active. It is stressful on the back to sit or stand in one place. Do not sit, drive, or stand in one place for more than 30 minutes at a time. Take short walks on level surfaces as soon as pain allows. Try to increase the length of time you walk each day. °· Do not stay in bed. Resting more than 1 or 2 days can delay your recovery. °· Do not avoid exercise or work. Your body is made to move. It is not dangerous to be active,  even though your back may hurt. Your back will likely heal faster if you return to being active before your pain is gone. °· Pay attention to your body when you  bend and lift. Many people have less discomfort when lifting if they bend their knees, keep the load close to their bodies, and avoid twisting. Often, the most comfortable positions are those that put less stress on your recovering back. °· Find a comfortable position to sleep. Use a firm mattress and lie on your side with your knees slightly bent. If you lie on your back, put a pillow under your knees. °· Only take over-the-counter or prescription medicines as directed by your caregiver. Over-the-counter medicines to reduce pain and inflammation are often the most helpful. Your caregiver may prescribe muscle relaxant drugs. These medicines help dull your pain so you can more quickly return to your normal activities and healthy exercise. °· Put ice on the injured area. °· Put ice in a plastic bag. °· Place a towel between your skin and the bag. °· Leave the ice on for 15-20 minutes, 03-04 times a day for the first 2 to 3 days. After that, ice and heat may be alternated to reduce pain and spasms. °· Ask your caregiver about trying back exercises and gentle massage. This may be of some benefit. °· Avoid feeling anxious or stressed. Stress increases muscle tension and can worsen back pain. It is important to recognize when you are anxious or stressed and learn ways to manage it. Exercise is a great option. °SEEK MEDICAL CARE IF: °· You have pain that is not relieved with rest or medicine. °· You have pain that does not improve in 1 week. °· You have new symptoms. °· You are generally not feeling well. °SEEK IMMEDIATE MEDICAL CARE IF:  °· You have pain that radiates from your back into your legs. °· You develop new bowel or bladder control problems. °· You have unusual weakness or numbness in your arms or legs. °· You develop nausea or vomiting. °· You develop  abdominal pain. °· You feel faint. °Document Released: 09/02/2005 Document Revised: 03/03/2012 Document Reviewed: 01/21/2011 °ExitCare® Patient Information ©2014 [employer]. ° °

## 2013-09-21 NOTE — ED Notes (Signed)
Pt reports lower back pain for the past week. Pt reports tripping and falling over a cable cord.

## 2013-09-21 NOTE — ED Provider Notes (Signed)
CSN: [id number]     Arrival date & time 09/21/13  1219 History   First MD Initiated Contact with Patient 09/21/13 1415     Chief Complaint  Patient presents with  . Back Pain   (Consider location/radiation/quality/duration/timing/severity/associated sxs/prior Treatment) HPI Comments: 43fwith Hx lupus and chronic back pain presents c/o low back pain for a week, getting gradually worse, since tripping and falling over a cable cord.  The pain has been getting progressively worse. The pain is located to her lower back only and is nonradiating. Denies any peripheral numbness or weakness. She denies any other systemic symptoms, there are no other injuries. No loss of bowel or bladder control.  Patient is a 53y.o. female presenting with back pain.  Back Pain Associated symptoms: no abdominal pain, no chest pain, no dysuria, no fever and no weakness     Past Medical History  Diagnosis Date  . Hypertension   . Coronary artery disease   . Lupus   . Arthritis   . Myocardial infarction 2009  . Hyperlipidemia   . Anxiety    Past Surgical History  Procedure Laterality Date  . Mastectomy     Family History  Problem Relation Age of Onset  . Stomach cancer    . Breast cancer    . Heart disease    . Stomach cancer Mother   . Breast cancer Sister   . Brain cancer Brother    History  Substance Use Topics  . Smoking status: Current Every Day Smoker -- 0.50 packs/day  . Smokeless tobacco: Never Used  . Alcohol Use: Yes   OB History   Grav Para Term Preterm Abortions TAB SAB Ect Mult Living                 Review of Systems  Constitutional: Negative for fever and chills.  Eyes: Negative for visual disturbance.  Respiratory: Negative for cough and shortness of breath.   Cardiovascular: Negative for chest pain, palpitations and leg swelling.  Gastrointestinal: Negative for nausea, vomiting and abdominal pain.  Endocrine: Negative for polydipsia and polyuria.  Genitourinary: Negative  for dysuria, urgency and frequency.  Musculoskeletal: Positive for back pain. Negative for arthralgias and myalgias.  Skin: Negative for rash.  Neurological: Negative for dizziness, weakness and light-headedness.    Allergies  Review of patient's allergies indicates no known allergies.  Home Medications   Current Outpatient Rx  Name  Route  Sig  Dispense  Refill  . ALPRAZolam (XANAX) 0.25 MG tablet   Oral   Take 0.25 mg by mouth daily as needed. For when nervous          . amoxicillin (AMOXIL) 500 MG capsule   Oral   Take 2 capsules (1,000 mg total) by mouth 2 (two) times daily.   56 capsule   0     Take for 14 days and then stop   . aspirin EC 81 MG tablet   Oral   Take 1 tablet (81 mg total) by mouth daily.   30 tablet   0   . clarithromycin (BIAXIN) 500 MG tablet   Oral   Take 1 tablet (500 mg total) by mouth 2 (two) times daily.   28 tablet   0     Take for 14 days and then stop   . clopidogrel (PLAVIX) 75 MG tablet   Oral   Take 75 mg by mouth daily.           . diclofenac (  VOLTAREN) 50 MG EC tablet   Oral   Take 50 mg by mouth daily.          Marland Kitchen esomeprazole (NEXIUM) 40 MG capsule   Oral   Take 1 capsule (40 mg total) by mouth 2 (two) times daily.   28 capsule   0     Lot: [id number] Expires: 05-2015   . HYDROcodone-acetaminophen (NORCO) 7.5-325 MG per tablet               . HYDROcodone-acetaminophen (NORCO) 7.5-325 MG per tablet   Oral   Take 1 tablet by mouth every 6 (six) hours as needed for moderate pain.   10 tablet   0   . losartan (COZAAR) 100 MG tablet   Oral   Take 100 mg by mouth daily.           . metoprolol (LOPRESSOR) 100 MG tablet   Oral   Take 100 mg by mouth 2 (two) times daily.         . metoprolol tartrate (LOPRESSOR) 25 MG tablet   Oral   Take 25 mg by mouth 2 (two) times daily.           Marland Kitchen NITROSTAT 0.4 MG SL tablet               . PredniSONE 10 MG KIT      12 day taper dose pack. Use as  directed   48 each   0   . simvastatin (ZOCOR) 40 MG tablet   Oral   Take 40 mg by mouth at bedtime.           . vitamin B-12 (CYANOCOBALAMIN) 500 MCG tablet   Oral   Take 500 mcg by mouth daily.          BP 137/93  Pulse 89  Temp(Src) 97.7 F (36.5 C) (Oral)  Resp 18  SpO2 96% Physical Exam  Nursing note and vitals reviewed. Constitutional: She is oriented to person, place, and time. Vital signs are normal. She appears well-developed and well-nourished. No distress.  HENT:  Head: Normocephalic and atraumatic.  Pulmonary/Chest: Effort normal. No respiratory distress.  Musculoskeletal:       Lumbar back: She exhibits decreased range of motion, bony tenderness and pain. She exhibits no swelling, no edema, no deformity, no laceration and no spasm.  Neurological: She is alert and oriented to person, place, and time. She has normal strength. Coordination normal.  Skin: Skin is warm and dry. No rash noted. She is not diaphoretic.  Psychiatric: She has a normal mood and affect. Judgment normal.    ED Course  Procedures (including critical care time) Labs Review Labs Reviewed  POCT URINALYSIS DIP (DEVICE) - Abnormal; Notable for the following:    Protein, ur 30 (*)    All other components within normal limits  POCT I-STAT, CHEM 8 - Abnormal; Notable for the following:    Potassium 3.6 (*)    Calcium, Ion 1.27 (*)    All other components within normal limits   Imaging Review Dg Lumbar Spine Complete  09/21/2013   CLINICAL DATA:  Fall at home 4 days ago.  Back pain.  EXAM: LUMBAR SPINE - COMPLETE 4+ VIEW  COMPARISON:  Acute abdominal 11/22/2011. Abdominal pelvic CT 03/19/2011.  FINDINGS: There are 5 lumbar type vertebral bodies. The alignment is normal. There is no evidence of fracture or pars defect. There is mild disc space loss and intervertebral spurring, greatest at the lower 2 levels.  Mild facet hypertrophy is present inferiorly. Aortic atherosclerosis is again noted.   IMPRESSION: Stable lumbar spondylosis and alignment.  No acute osseous findings.   Electronically Signed   By: Camie Patience M.D.   On: 09/21/2013 16:02      MDM   1. Back pain    X-ray and is both normal. Treating for back pain. Of note, this patient was at the emergency department 2 days ago with a different story for back pain, and timelines do not coincide, she left without being seen. Also, I reviewed the [place] controlled substances database and she is just at the point where she would be getting a refill of chronic pain medications so she is probably out of those as well. She does appear to be in pain so we'll treat with small quantity of Norco, also prednisone in case this is been partially mediated by lupus. She will followup if not improving. There are no red flag symptoms at this time   Meds ordered this encounter  Medications  . aspirin EC 81 MG tablet    Sig: Take 1 tablet (81 mg total) by mouth daily.    Dispense:  30 tablet    Refill:  0    Order Specific Question:  Supervising Provider    Answer:  Lynne Leader, [place]  . PredniSONE 10 MG KIT    Sig: 12 day taper dose pack. Use as directed    Dispense:  48 each    Refill:  0    Order Specific Question:  Supervising Provider    Answer:  Lynne Leader, [place]  . HYDROcodone-acetaminophen (NORCO) 7.5-325 MG per tablet    Sig: Take 1 tablet by mouth every 6 (six) hours as needed for moderate pain.    Dispense:  10 tablet    Refill:  0    Order Specific Question:  Supervising Provider    Answer:  Lynne Leader, [place]       Liam Graham, PA-C 09/21/13 [phone]

## 2013-09-22 NOTE — ED Provider Notes (Signed)
Medical screening examination/treatment/procedure(s) were performed by a resident physician or non-physician practitioner and as the supervising physician I was immediately available for consultation/collaboration.  Lynne Leader, MD    Gregor Hams, MD 09/22/13 [phone]

## 2013-11-24 ENCOUNTER — Ambulatory Visit
Admission: RE | Admit: 2013-11-24 | Discharge: 2013-11-24 | Disposition: A | Discharge status: Home / Self Care | Payer: PRIVATE HEALTH INSURANCE | Source: Ambulatory Visit | Attending: Internal Medicine | Admitting: Internal Medicine

## 2013-11-24 ENCOUNTER — Other Ambulatory Visit: Payer: Self-pay | Admitting: Internal Medicine

## 2013-11-24 DIAGNOSIS — Z1231 Encounter for screening mammogram for malignant neoplasm of breast: Secondary | ICD-10-CM

## 2014-06-17 ENCOUNTER — Other Ambulatory Visit: Payer: Self-pay | Admitting: Internal Medicine

## 2014-06-17 DIAGNOSIS — R5381 Other malaise: Secondary | ICD-10-CM

## 2014-10-12 ENCOUNTER — Other Ambulatory Visit (HOSPITAL_COMMUNITY)
Admission: RE | Admit: 2014-10-12 | Discharge: 2014-10-12 | Disposition: A | Discharge status: Home / Self Care | Payer: Medicaid Other | Source: Ambulatory Visit | Attending: Obstetrics & Gynecology | Admitting: Obstetrics & Gynecology

## 2014-10-12 ENCOUNTER — Encounter: Payer: Self-pay | Admitting: Obstetrics & Gynecology

## 2014-10-12 ENCOUNTER — Ambulatory Visit (INDEPENDENT_AMBULATORY_CARE_PROVIDER_SITE_OTHER): Payer: PRIVATE HEALTH INSURANCE | Admitting: Obstetrics & Gynecology

## 2014-10-12 VITALS — BP 121/82 | HR 79 | Temp 98.2°F | Ht 67.0 in | Wt 133.6 lb

## 2014-10-12 DIAGNOSIS — Z01419 Encounter for gynecological examination (general) (routine) without abnormal findings: Secondary | ICD-10-CM

## 2014-10-12 DIAGNOSIS — Z124 Encounter for screening for malignant neoplasm of cervix: Secondary | ICD-10-CM

## 2014-10-12 DIAGNOSIS — Z1151 Encounter for screening for human papillomavirus (HPV): Secondary | ICD-10-CM | POA: Insufficient documentation

## 2014-10-12 DIAGNOSIS — Z1231 Encounter for screening mammogram for malignant neoplasm of breast: Secondary | ICD-10-CM

## 2014-10-12 NOTE — Progress Notes (Signed)
     GYNECOLOGY CLINIC ANNUAL PREVENTATIVE CARE ENCOUNTER NOTE  Subjective:     [name] is a 54 y.o. [phone] female here for a routine annual gynecologic exam.  Current complaints: none   Gynecologic History No LMP recorded. Patient is postmenopausal. Last Pap: many years ago. Results were: normal Last mammogram: 11/24/13. Results were: normal  Obstetric History OB History  Gravida Para Term Preterm AB SAB TAB Ectopic Multiple Living  4 2 2  0 2 0 2 0 0 2    # Outcome Date GA Lbr Len/2nd Weight Sex Delivery Anes PTL Lv  4 TAB           3 TAB           2 Term           1 Term               History   Social History  . Marital Status: Single    Spouse Name: N/A    Number of Children: N/A  . Years of Education: N/A   Occupational History  . Not on file.   Social History Main Topics  . Smoking status: Current Every Day Smoker -- 0.50 packs/day  . Smokeless tobacco: Never Used  . Alcohol Use: Yes  . Drug Use: No  . Sexual Activity: Not on file   Other Topics Concern  . Not on file   Social History Narrative    The following portions of the patient's history were reviewed and updated as appropriate: allergies, current medications, past family history, past medical history, past social history, past surgical history and problem list.  Review of Systems A comprehensive review of systems was negative.   Objective:   BP 121/82 mmHg  Pulse 79  Temp(Src) 98.2 F (36.8 C) (Oral)  Ht 5\' 7"  (1.702 m)  Wt 133 lb 9.6 oz (60.601 kg)  BMI 20.92 kg/m2 GENERAL: Well-developed, well-nourished female in no acute distress.  HEENT: Normocephalic, atraumatic. Sclerae anicteric.  NECK: Supple. Normal thyroid.  LUNGS: Clear to auscultation bilaterally.  HEART: Regular rate and rhythm. BREASTS: Left breast significantly larger than right (patient reports having two right breasts, one was surgically excised many years ago which caused size discrepancy). Well-healed right  mastectomy scar. No masses, skin changes, nipple drainage, or lymphadenopathy. ABDOMEN: Soft, nontender, nondistended. No organomegaly. PELVIC: Normal external female genitalia. Vagina is pink and rugated.  Normal discharge. Normal cervix contour, cervical stenosis noted. Pap smear obtained but no endocervical cells obtained. Uterus is normal in size. No adnexal mass or tenderness.  EXTREMITIES: No cyanosis, clubbing, or edema, 2+ distal pulses.   Assessment:   Annual gynecologic examination   Plan:   Pap done, will follow up results and manage accordingly. Mammogram scheduled Routine preventative health maintenance measures emphasized   Verita Schneiders, MD, [place] Attending [place] for [place]

## 2014-10-12 NOTE — Patient Instructions (Signed)
Preventive Care for Adults A healthy lifestyle and preventive care can promote health and wellness. Preventive health guidelines for women include the following key practices.  A routine yearly physical is a good way to check with your health care provider about your health and preventive screening. It is a chance to share any concerns and updates on your health and to receive a thorough exam.  Visit your dentist for a routine exam and preventive care every 6 months. Brush your teeth twice a day and floss once a day. Good oral hygiene prevents tooth decay and gum disease.  The frequency of eye exams is based on your age, health, family medical history, use of contact lenses, and other factors. Follow your health care provider's recommendations for frequency of eye exams.  Eat a healthy diet. Foods like vegetables, fruits, whole grains, low-fat dairy products, and lean protein foods contain the nutrients you need without too many calories. Decrease your intake of foods high in solid fats, added sugars, and salt. Eat the right amount of calories for you.Get information about a proper diet from your health care provider, if necessary.  Regular physical exercise is one of the most important things you can do for your health. Most adults should get at least 150 minutes of moderate-intensity exercise (any activity that increases your heart rate and causes you to sweat) each week. In addition, most adults need muscle-strengthening exercises on 2 or more days a week.  Maintain a healthy weight. The body mass index (BMI) is a screening tool to identify possible weight problems. It provides an estimate of body fat based on height and weight. Your health care provider can find your BMI and can help you achieve or maintain a healthy weight.For adults 20 years and older:  A BMI below 18.5 is considered underweight.  A BMI of 18.5 to 24.9 is normal.  A BMI of 25 to 29.9 is considered overweight.  A BMI of  30 and above is considered obese.  Maintain normal blood lipids and cholesterol levels by exercising and minimizing your intake of saturated fat. Eat a balanced diet with plenty of fruit and vegetables. Blood tests for lipids and cholesterol should begin at age 76 and be repeated every 5 years. If your lipid or cholesterol levels are high, you are over 50, or you are at high risk for heart disease, you may need your cholesterol levels checked more frequently.Ongoing high lipid and cholesterol levels should be treated with medicines if diet and exercise are not working.  If you smoke, find out from your health care provider how to quit. If you do not use tobacco, do not start.  Lung cancer screening is recommended for adults aged 22-80 years who are at high risk for developing lung cancer because of a history of smoking. A yearly low-dose CT scan of the lungs is recommended for people who have at least a 30-pack-year history of smoking and are a current smoker or have quit within the past 15 years. A pack year of smoking is smoking an average of 1 pack of cigarettes a day for 1 year (for example: 1 pack a day for 30 years or 2 packs a day for 15 years). Yearly screening should continue until the smoker has stopped smoking for at least 15 years. Yearly screening should be stopped for people who develop a health problem that would prevent them from having lung cancer treatment.  If you are pregnant, do not drink alcohol. If you are breastfeeding,  be very cautious about drinking alcohol. If you are not pregnant and choose to drink alcohol, do not have more than 1 drink per day. One drink is considered to be 12 ounces (355 mL) of beer, 5 ounces (148 mL) of wine, or 1.5 ounces (44 mL) of liquor.  Avoid use of street drugs. Do not share needles with anyone. Ask for help if you need support or instructions about stopping the use of drugs.  High blood pressure causes heart disease and increases the risk of  stroke. Your blood pressure should be checked at least every 1 to 2 years. Ongoing high blood pressure should be treated with medicines if weight loss and exercise do not work.  If you are 75-52 years old, ask your health care provider if you should take aspirin to prevent strokes.  Diabetes screening involves taking a blood sample to check your fasting blood sugar level. This should be done once every 3 years, after age 15, if you are within normal weight and without risk factors for diabetes. Testing should be considered at a younger age or be carried out more frequently if you are overweight and have at least 1 risk factor for diabetes.  Breast cancer screening is essential preventive care for women. You should practice "breast self-awareness." This means understanding the normal appearance and feel of your breasts and may include breast self-examination. Any changes detected, no matter how small, should be reported to a health care provider. Women in their 58s and 30s should have a clinical breast exam (CBE) by a health care provider as part of a regular health exam every 1 to 3 years. After age 16, women should have a CBE every year. Starting at age 53, women should consider having a mammogram (breast X-ray test) every year. Women who have a family history of breast cancer should talk to their health care provider about genetic screening. Women at a high risk of breast cancer should talk to their health care providers about having an MRI and a mammogram every year.  Breast cancer gene (BRCA)-related cancer risk assessment is recommended for women who have family members with BRCA-related cancers. BRCA-related cancers include breast, ovarian, tubal, and peritoneal cancers. Having family members with these cancers may be associated with an increased risk for harmful changes (mutations) in the breast cancer genes BRCA1 and BRCA2. Results of the assessment will determine the need for genetic counseling and  BRCA1 and BRCA2 testing.  Routine pelvic exams to screen for cancer are no longer recommended for nonpregnant women who are considered low risk for cancer of the pelvic organs (ovaries, uterus, and vagina) and who do not have symptoms. Ask your health care provider if a screening pelvic exam is right for you.  If you have had past treatment for cervical cancer or a condition that could lead to cancer, you need Pap tests and screening for cancer for at least 20 years after your treatment. If Pap tests have been discontinued, your risk factors (such as having a new sexual partner) need to be reassessed to determine if screening should be resumed. Some women have medical problems that increase the chance of getting cervical cancer. In these cases, your health care provider may recommend more frequent screening and Pap tests.  The HPV test is an additional test that may be used for cervical cancer screening. The HPV test looks for the virus that can cause the cell changes on the cervix. The cells collected during the Pap test can be  tested for HPV. The HPV test could be used to screen women aged 30 years and older, and should be used in women of any age who have unclear Pap test results. After the age of 30, women should have HPV testing at the same frequency as a Pap test.  Colorectal cancer can be detected and often prevented. Most routine colorectal cancer screening begins at the age of 50 years and continues through age 75 years. However, your health care provider may recommend screening at an earlier age if you have risk factors for colon cancer. On a yearly basis, your health care provider may provide home test kits to check for hidden blood in the stool. Use of a small camera at the end of a tube, to directly examine the colon (sigmoidoscopy or colonoscopy), can detect the earliest forms of colorectal cancer. Talk to your health care provider about this at age 50, when routine screening begins. Direct  exam of the colon should be repeated every 5-10 years through age 75 years, unless early forms of pre-cancerous polyps or small growths are found.  People who are at an increased risk for hepatitis B should be screened for this virus. You are considered at high risk for hepatitis B if:  You were born in a country where hepatitis B occurs often. Talk with your health care provider about which countries are considered high risk.  Your parents were born in a high-risk country and you have not received a shot to protect against hepatitis B (hepatitis B vaccine).  You have HIV or AIDS.  You use needles to inject street drugs.  You live with, or have sex with, someone who has hepatitis B.  You get hemodialysis treatment.  You take certain medicines for conditions like cancer, organ transplantation, and autoimmune conditions.  Hepatitis C blood testing is recommended for all people born from 1945 through 1965 and any individual with known risks for hepatitis C.  Practice safe sex. Use condoms and avoid high-risk sexual practices to reduce the spread of sexually transmitted infections (STIs). STIs include gonorrhea, chlamydia, syphilis, trichomonas, herpes, HPV, and human immunodeficiency virus (HIV). Herpes, HIV, and HPV are viral illnesses that have no cure. They can result in disability, cancer, and death.  You should be screened for sexually transmitted illnesses (STIs) including gonorrhea and chlamydia if:  You are sexually active and are younger than 24 years.  You are older than 24 years and your health care provider tells you that you are at risk for this type of infection.  Your sexual activity has changed since you were last screened and you are at an increased risk for chlamydia or gonorrhea. Ask your health care provider if you are at risk.  If you are at risk of being infected with HIV, it is recommended that you take a prescription medicine daily to prevent HIV infection. This is  called preexposure prophylaxis (PrEP). You are considered at risk if:  You are a heterosexual woman, are sexually active, and are at increased risk for HIV infection.  You take drugs by injection.  You are sexually active with a partner who has HIV.  Talk with your health care provider about whether you are at high risk of being infected with HIV. If you choose to begin PrEP, you should first be tested for HIV. You should then be tested every 3 months for as long as you are taking PrEP.  Osteoporosis is a disease in which the bones lose minerals and strength   with aging. This can result in serious bone fractures or breaks. The risk of osteoporosis can be identified using a bone density scan. Women ages 65 years and over and women at risk for fractures or osteoporosis should discuss screening with their health care providers. Ask your health care provider whether you should take a calcium supplement or vitamin D to reduce the rate of osteoporosis.  Menopause can be associated with physical symptoms and risks. Hormone replacement therapy is available to decrease symptoms and risks. You should talk to your health care provider about whether hormone replacement therapy is right for you.  Use sunscreen. Apply sunscreen liberally and repeatedly throughout the day. You should seek shade when your shadow is shorter than you. Protect yourself by wearing long sleeves, pants, a wide-brimmed hat, and sunglasses year round, whenever you are outdoors.  Once a month, do a whole body skin exam, using a mirror to look at the skin on your back. Tell your health care provider of new moles, moles that have irregular borders, moles that are larger than a pencil eraser, or moles that have changed in shape or color.  Stay current with required vaccines (immunizations).  Influenza vaccine. All adults should be immunized every year.  Tetanus, diphtheria, and acellular pertussis (Td, Tdap) vaccine. Pregnant women should  receive 1 dose of Tdap vaccine during each pregnancy. The dose should be obtained regardless of the length of time since the last dose. Immunization is preferred during the 27th-36th week of gestation. An adult who has not previously received Tdap or who does not know her vaccine status should receive 1 dose of Tdap. This initial dose should be followed by tetanus and diphtheria toxoids (Td) booster doses every 10 years. Adults with an unknown or incomplete history of completing a 3-dose immunization series with Td-containing vaccines should begin or complete a primary immunization series including a Tdap dose. Adults should receive a Td booster every 10 years.  Varicella vaccine. An adult without evidence of immunity to varicella should receive 2 doses or a second dose if she has previously received 1 dose. Pregnant females who do not have evidence of immunity should receive the first dose after pregnancy. This first dose should be obtained before leaving the health care facility. The second dose should be obtained 4-8 weeks after the first dose.  Human papillomavirus (HPV) vaccine. Females aged 13-26 years who have not received the vaccine previously should obtain the 3-dose series. The vaccine is not recommended for use in pregnant females. However, pregnancy testing is not needed before receiving a dose. If a female is found to be pregnant after receiving a dose, no treatment is needed. In that case, the remaining doses should be delayed until after the pregnancy. Immunization is recommended for any person with an immunocompromised condition through the age of 26 years if she did not get any or all doses earlier. During the 3-dose series, the second dose should be obtained 4-8 weeks after the first dose. The third dose should be obtained 24 weeks after the first dose and 16 weeks after the second dose.  Zoster vaccine. One dose is recommended for adults aged 60 years or older unless certain conditions are  present.  Measles, mumps, and rubella (MMR) vaccine. Adults born before 1957 generally are considered immune to measles and mumps. Adults born in 1957 or later should have 1 or more doses of MMR vaccine unless there is a contraindication to the vaccine or there is laboratory evidence of immunity to   each of the three diseases. A routine second dose of MMR vaccine should be obtained at least 28 days after the first dose for students attending postsecondary schools, health care workers, or international travelers. People who received inactivated measles vaccine or an unknown type of measles vaccine during 1963-1967 should receive 2 doses of MMR vaccine. People who received inactivated mumps vaccine or an unknown type of mumps vaccine before 1979 and are at high risk for mumps infection should consider immunization with 2 doses of MMR vaccine. For females of childbearing age, rubella immunity should be determined. If there is no evidence of immunity, females who are not pregnant should be vaccinated. If there is no evidence of immunity, females who are pregnant should delay immunization until after pregnancy. Unvaccinated health care workers born before 1957 who lack laboratory evidence of measles, mumps, or rubella immunity or laboratory confirmation of disease should consider measles and mumps immunization with 2 doses of MMR vaccine or rubella immunization with 1 dose of MMR vaccine.  Pneumococcal 13-valent conjugate (PCV13) vaccine. When indicated, a person who is uncertain of her immunization history and has no record of immunization should receive the PCV13 vaccine. An adult aged 19 years or older who has certain medical conditions and has not been previously immunized should receive 1 dose of PCV13 vaccine. This PCV13 should be followed with a dose of pneumococcal polysaccharide (PPSV23) vaccine. The PPSV23 vaccine dose should be obtained at least 8 weeks after the dose of PCV13 vaccine. An adult aged 19  years or older who has certain medical conditions and previously received 1 or more doses of PPSV23 vaccine should receive 1 dose of PCV13. The PCV13 vaccine dose should be obtained 1 or more years after the last PPSV23 vaccine dose.  Pneumococcal polysaccharide (PPSV23) vaccine. When PCV13 is also indicated, PCV13 should be obtained first. All adults aged 65 years and older should be immunized. An adult younger than age 65 years who has certain medical conditions should be immunized. Any person who resides in a nursing home or long-term care facility should be immunized. An adult smoker should be immunized. People with an immunocompromised condition and certain other conditions should receive both PCV13 and PPSV23 vaccines. People with human immunodeficiency virus (HIV) infection should be immunized as soon as possible after diagnosis. Immunization during chemotherapy or radiation therapy should be avoided. Routine use of PPSV23 vaccine is not recommended for American Indians, [place] Natives, or people younger than 65 years unless there are medical conditions that require PPSV23 vaccine. When indicated, people who have unknown immunization and have no record of immunization should receive PPSV23 vaccine. One-time revaccination 5 years after the first dose of PPSV23 is recommended for people aged 19-64 years who have chronic kidney failure, nephrotic syndrome, asplenia, or immunocompromised conditions. People who received 1-2 doses of PPSV23 before age 65 years should receive another dose of PPSV23 vaccine at age 65 years or later if at least 5 years have passed since the previous dose. Doses of PPSV23 are not needed for people immunized with PPSV23 at or after age 65 years.  Meningococcal vaccine. Adults with asplenia or persistent complement component deficiencies should receive 2 doses of quadrivalent meningococcal conjugate (MenACWY-D) vaccine. The doses should be obtained at least 2 months apart.  Microbiologists working with certain meningococcal bacteria, military recruits, people at risk during an outbreak, and people who travel to or live in countries with a high rate of meningitis should be immunized. A first-year college student up through age   21 years who is living in a residence hall should receive a dose if she did not receive a dose on or after her 16th birthday. Adults who have certain high-risk conditions should receive one or more doses of vaccine.  Hepatitis A vaccine. Adults who wish to be protected from this disease, have certain high-risk conditions, work with hepatitis A-infected animals, work in hepatitis A research labs, or travel to or work in countries with a high rate of hepatitis A should be immunized. Adults who were previously unvaccinated and who anticipate close contact with an international adoptee during the first 60 days after arrival in the [place] from a country with a high rate of hepatitis A should be immunized.  Hepatitis B vaccine. Adults who wish to be protected from this disease, have certain high-risk conditions, may be exposed to blood or other infectious body fluids, are household contacts or sex partners of hepatitis B positive people, are clients or workers in certain care facilities, or travel to or work in countries with a high rate of hepatitis B should be immunized.  Haemophilus influenzae type b (Hib) vaccine. A previously unvaccinated person with asplenia or sickle cell disease or having a scheduled splenectomy should receive 1 dose of Hib vaccine. Regardless of previous immunization, a recipient of a hematopoietic stem cell transplant should receive a 3-dose series 6-12 months after her successful transplant. Hib vaccine is not recommended for adults with HIV infection. Preventive Services / Frequency Ages 64 to 68 years  Blood pressure check.** / Every 1 to 2 years.  Lipid and cholesterol check.** / Every 5 years beginning at age  22.  Clinical breast exam.** / Every 3 years for women in their 88s and 53s.  BRCA-related cancer risk assessment.** / For women who have family members with a BRCA-related cancer (breast, ovarian, tubal, or peritoneal cancers).  Pap test.** / Every 2 years from ages 90 through 51. Every 3 years starting at age 21 through age 56 or 3 with a history of 3 consecutive normal Pap tests.  HPV screening.** / Every 3 years from ages 24 through ages 1 to 46 with a history of 3 consecutive normal Pap tests.  Hepatitis C blood test.** / For any individual with known risks for hepatitis C.  Skin self-exam. / Monthly.  Influenza vaccine. / Every year.  Tetanus, diphtheria, and acellular pertussis (Tdap, Td) vaccine.** / Consult your health care provider. Pregnant women should receive 1 dose of Tdap vaccine during each pregnancy. 1 dose of Td every 10 years.  Varicella vaccine.** / Consult your health care provider. Pregnant females who do not have evidence of immunity should receive the first dose after pregnancy.  HPV vaccine. / 3 doses over 6 months, if 72 and younger. The vaccine is not recommended for use in pregnant females. However, pregnancy testing is not needed before receiving a dose.  Measles, mumps, rubella (MMR) vaccine.** / You need at least 1 dose of MMR if you were born in 1957 or later. You may also need a 2nd dose. For females of childbearing age, rubella immunity should be determined. If there is no evidence of immunity, females who are not pregnant should be vaccinated. If there is no evidence of immunity, females who are pregnant should delay immunization until after pregnancy.  Pneumococcal 13-valent conjugate (PCV13) vaccine.** / Consult your health care provider.  Pneumococcal polysaccharide (PPSV23) vaccine.** / 1 to 2 doses if you smoke cigarettes or if you have certain conditions.  Meningococcal vaccine.** /  1 dose if you are age 19 to 21 years and a first-year college  student living in a residence hall, or have one of several medical conditions, you need to get vaccinated against meningococcal disease. You may also need additional booster doses.  Hepatitis A vaccine.** / Consult your health care provider.  Hepatitis B vaccine.** / Consult your health care provider.  Haemophilus influenzae type b (Hib) vaccine.** / Consult your health care provider. Ages 40 to 64 years  Blood pressure check.** / Every 1 to 2 years.  Lipid and cholesterol check.** / Every 5 years beginning at age 20 years.  Lung cancer screening. / Every year if you are aged 55-80 years and have a 30-pack-year history of smoking and currently smoke or have quit within the past 15 years. Yearly screening is stopped once you have quit smoking for at least 15 years or develop a health problem that would prevent you from having lung cancer treatment.  Clinical breast exam.** / Every year after age 40 years.  BRCA-related cancer risk assessment.** / For women who have family members with a BRCA-related cancer (breast, ovarian, tubal, or peritoneal cancers).  Mammogram.** / Every year beginning at age 40 years and continuing for as long as you are in good health. Consult with your health care provider.  Pap test.** / Every 3 years starting at age 30 years through age 65 or 70 years with a history of 3 consecutive normal Pap tests.  HPV screening.** / Every 3 years from ages 30 years through ages 65 to 70 years with a history of 3 consecutive normal Pap tests.  Fecal occult blood test (FOBT) of stool. / Every year beginning at age 50 years and continuing until age 75 years. You may not need to do this test if you get a colonoscopy every 10 years.  Flexible sigmoidoscopy or colonoscopy.** / Every 5 years for a flexible sigmoidoscopy or every 10 years for a colonoscopy beginning at age 50 years and continuing until age 75 years.  Hepatitis C blood test.** / For all people born from 1945 through  1965 and any individual with known risks for hepatitis C.  Skin self-exam. / Monthly.  Influenza vaccine. / Every year.  Tetanus, diphtheria, and acellular pertussis (Tdap/Td) vaccine.** / Consult your health care provider. Pregnant women should receive 1 dose of Tdap vaccine during each pregnancy. 1 dose of Td every 10 years.  Varicella vaccine.** / Consult your health care provider. Pregnant females who do not have evidence of immunity should receive the first dose after pregnancy.  Zoster vaccine.** / 1 dose for adults aged 60 years or older.  Measles, mumps, rubella (MMR) vaccine.** / You need at least 1 dose of MMR if you were born in 1957 or later. You may also need a 2nd dose. For females of childbearing age, rubella immunity should be determined. If there is no evidence of immunity, females who are not pregnant should be vaccinated. If there is no evidence of immunity, females who are pregnant should delay immunization until after pregnancy.  Pneumococcal 13-valent conjugate (PCV13) vaccine.** / Consult your health care provider.  Pneumococcal polysaccharide (PPSV23) vaccine.** / 1 to 2 doses if you smoke cigarettes or if you have certain conditions.  Meningococcal vaccine.** / Consult your health care provider.  Hepatitis A vaccine.** / Consult your health care provider.  Hepatitis B vaccine.** / Consult your health care provider.  Haemophilus influenzae type b (Hib) vaccine.** / Consult your health care provider. Ages 65   years and over  Blood pressure check.** / Every 1 to 2 years.  Lipid and cholesterol check.** / Every 5 years beginning at age 22 years.  Lung cancer screening. / Every year if you are aged 73-80 years and have a 30-pack-year history of smoking and currently smoke or have quit within the past 15 years. Yearly screening is stopped once you have quit smoking for at least 15 years or develop a health problem that would prevent you from having lung cancer  treatment.  Clinical breast exam.** / Every year after age 4 years.  BRCA-related cancer risk assessment.** / For women who have family members with a BRCA-related cancer (breast, ovarian, tubal, or peritoneal cancers).  Mammogram.** / Every year beginning at age 40 years and continuing for as long as you are in good health. Consult with your health care provider.  Pap test.** / Every 3 years starting at age 9 years through age 34 or 91 years with 3 consecutive normal Pap tests. Testing can be stopped between 65 and 70 years with 3 consecutive normal Pap tests and no abnormal Pap or HPV tests in the past 10 years.  HPV screening.** / Every 3 years from ages 57 years through ages 64 or 45 years with a history of 3 consecutive normal Pap tests. Testing can be stopped between 65 and 70 years with 3 consecutive normal Pap tests and no abnormal Pap or HPV tests in the past 10 years.  Fecal occult blood test (FOBT) of stool. / Every year beginning at age 15 years and continuing until age 17 years. You may not need to do this test if you get a colonoscopy every 10 years.  Flexible sigmoidoscopy or colonoscopy.** / Every 5 years for a flexible sigmoidoscopy or every 10 years for a colonoscopy beginning at age 86 years and continuing until age 71 years.  Hepatitis C blood test.** / For all people born from [birth year] through 1965 and any individual with known risks for hepatitis C.  Osteoporosis screening.** / A one-time screening for women ages 83 years and over and women at risk for fractures or osteoporosis.  Skin self-exam. / Monthly.  Influenza vaccine. / Every year.  Tetanus, diphtheria, and acellular pertussis (Tdap/Td) vaccine.** / 1 dose of Td every 10 years.  Varicella vaccine.** / Consult your health care provider.  Zoster vaccine.** / 1 dose for adults aged 61 years or older.  Pneumococcal 13-valent conjugate (PCV13) vaccine.** / Consult your health care provider.  Pneumococcal  polysaccharide (PPSV23) vaccine.** / 1 dose for all adults aged 28 years and older.  Meningococcal vaccine.** / Consult your health care provider.  Hepatitis A vaccine.** / Consult your health care provider.  Hepatitis B vaccine.** / Consult your health care provider.  Haemophilus influenzae type b (Hib) vaccine.** / Consult your health care provider. ** Family history and personal history of risk and conditions may change your health care provider's recommendations. Document Released: 10/29/2001 Document Revised: 01/17/2014 Document Reviewed: 01/28/2011 [facility] Patient Information 2015 [place], [place]. This information is not intended to replace advice given to you by your health care provider. Make sure you discuss any questions you have with your health care provider.

## 2014-10-14 LAB — CYTOLOGY - PAP

## 2014-12-01 ENCOUNTER — Ambulatory Visit: Payer: Medicaid Other

## 2015-07-16 ENCOUNTER — Encounter (HOSPITAL_COMMUNITY): Payer: Self-pay | Admitting: *Deleted

## 2015-07-16 ENCOUNTER — Emergency Department (HOSPITAL_COMMUNITY)
Admission: EM | Admit: 2015-07-16 | Discharge: 2015-07-16 | Disposition: A | Discharge status: Home / Self Care | Payer: Medicare Other | Attending: Emergency Medicine | Admitting: Emergency Medicine

## 2015-07-16 DIAGNOSIS — R21 Rash and other nonspecific skin eruption: Secondary | ICD-10-CM | POA: Diagnosis present

## 2015-07-16 DIAGNOSIS — R197 Diarrhea, unspecified: Secondary | ICD-10-CM | POA: Diagnosis not present

## 2015-07-16 DIAGNOSIS — Z79899 Other long term (current) drug therapy: Secondary | ICD-10-CM | POA: Diagnosis not present

## 2015-07-16 DIAGNOSIS — I10 Essential (primary) hypertension: Secondary | ICD-10-CM | POA: Insufficient documentation

## 2015-07-16 DIAGNOSIS — I252 Old myocardial infarction: Secondary | ICD-10-CM | POA: Insufficient documentation

## 2015-07-16 DIAGNOSIS — R233 Spontaneous ecchymoses: Secondary | ICD-10-CM | POA: Diagnosis not present

## 2015-07-16 DIAGNOSIS — Z791 Long term (current) use of non-steroidal anti-inflammatories (NSAID): Secondary | ICD-10-CM | POA: Insufficient documentation

## 2015-07-16 DIAGNOSIS — I2510 Atherosclerotic heart disease of native coronary artery without angina pectoris: Secondary | ICD-10-CM | POA: Insufficient documentation

## 2015-07-16 DIAGNOSIS — F419 Anxiety disorder, unspecified: Secondary | ICD-10-CM | POA: Insufficient documentation

## 2015-07-16 DIAGNOSIS — E785 Hyperlipidemia, unspecified: Secondary | ICD-10-CM | POA: Insufficient documentation

## 2015-07-16 DIAGNOSIS — Z720 Tobacco use: Secondary | ICD-10-CM | POA: Insufficient documentation

## 2015-07-16 DIAGNOSIS — Z7982 Long term (current) use of aspirin: Secondary | ICD-10-CM | POA: Diagnosis not present

## 2015-07-16 DIAGNOSIS — M069 Rheumatoid arthritis, unspecified: Secondary | ICD-10-CM | POA: Diagnosis not present

## 2015-07-16 LAB — COMPREHENSIVE METABOLIC PANEL
ALBUMIN: 3.9 g/dL (ref 3.5–5.0)
ALK PHOS: 105 U/L (ref 38–126)
ALT: 26 U/L (ref 14–54)
AST: 40 U/L (ref 15–41)
Anion gap: 11 (ref 5–15)
BILIRUBIN TOTAL: 0.5 mg/dL (ref 0.3–1.2)
BUN: 10 mg/dL (ref 6–20)
CALCIUM: 9.2 mg/dL (ref 8.9–10.3)
CO2: 20 mmol/L — ABNORMAL LOW (ref 22–32)
CREATININE: 0.75 mg/dL (ref 0.44–1.00)
Chloride: 106 mmol/L (ref 101–111)
GFR calc Af Amer: >60 mL/min (ref >60)
GFR calc non Af Amer: >60 mL/min (ref >60)
GLUCOSE: 87 mg/dL (ref 65–99)
Potassium: 3.7 mmol/L (ref 3.5–5.1)
Sodium: 137 mmol/L (ref 135–145)
Total Protein: 8.2 g/dL — ABNORMAL HIGH (ref 6.5–8.1)

## 2015-07-16 LAB — LIPASE, BLOOD: Lipase: 33 U/L (ref 11–51)

## 2015-07-16 LAB — CBC
HCT: 33.3 % — ABNORMAL LOW (ref 36.0–46.0)
Hemoglobin: 11.2 g/dL — ABNORMAL LOW (ref 12.0–15.0)
MCH: 30.9 pg (ref 26.0–34.0)
MCHC: 33.6 g/dL (ref 30.0–36.0)
MCV: 92 fL (ref 78.0–100.0)
PLATELETS: 279 10*3/uL (ref 150–400)
RBC: 3.62 MIL/uL — ABNORMAL LOW (ref 3.87–5.11)
RDW: 14.1 % (ref 11.5–15.5)
WBC: 4.7 10*3/uL (ref 4.0–10.5)

## 2015-07-16 MED ORDER — HYDROXYZINE HCL 25 MG PO TABS: 25.0000 mg | ORAL_TABLET | Freq: Four times a day (QID) | ORAL | Status: DC

## 2015-07-16 NOTE — ED Notes (Addendum)
Pt with hx of lupus states pruritic rash since last night.  States diarrhea x 1 week.

## 2015-07-16 NOTE — ED Provider Notes (Signed)
CSN: [id number]     Arrival date & time 07/16/15  0710 History   First MD Initiated Contact with Patient 07/16/15 0913     Chief Complaint  Patient presents with  . Rash    hx of lupus  . Diarrhea     (Consider location/radiation/quality/duration/timing/severity/associated sxs/prior Treatment) Patient is a 54 y.o. female presenting with rash and diarrhea. The history is provided by the patient.  Rash Location:  Full body Quality: redness   Quality: not blistering, not bruising, not burning, not itchy, not painful, not peeling and not weeping   Severity:  Mild Onset quality:  Sudden Timing:  Constant Chronicity:  New Context: not medications, not new detergent/soap, not nuts, not plant contact, not pollen, not sick contacts and not sun exposure   Relieved by:  Nothing Worsened by:  Nothing tried Associated symptoms: diarrhea   Associated symptoms: no fever, no shortness of breath and not vomiting   Diarrhea Associated symptoms: no chills, no fever and no vomiting     Past Medical History  Diagnosis Date  . Hypertension   . Coronary artery disease   . Lupus ([place])   . Myocardial infarction ([place]) 2009  . Hyperlipidemia   . Anxiety   . Arthritis     rheumatoid   Past Surgical History  Procedure Laterality Date  . Mastectomy     Family History  Problem Relation Age of Onset  . Stomach cancer    . Breast cancer    . Heart disease    . Stomach cancer Mother   . Breast cancer Sister   . Brain cancer Brother    Social History  Substance Use Topics  . Smoking status: Current Every Day Smoker -- 0.25 packs/day  . Smokeless tobacco: Never Used  . Alcohol Use: Yes   OB History    Gravida Para Term Preterm AB TAB SAB Ectopic Multiple Living   4 2 2  0 2 2 0 0 0 2     Review of Systems  Constitutional: Negative for fever and chills.  Respiratory: Negative for cough and shortness of breath.   Gastrointestinal: Positive for diarrhea. Negative for vomiting.  Skin:  Positive for rash.  All other systems reviewed and are negative.     Allergies  Review of patient's allergies indicates no known allergies.  Home Medications   Prior to Admission medications   Medication Sig Start Date End Date Taking? Authorizing Provider  ALPRAZolam (XANAX) 0.25 MG tablet Take 0.25 mg by mouth daily as needed. For when nervous    Yes Historical Provider, MD  aspirin EC 81 MG tablet Take 1 tablet (81 mg total) by mouth daily. 09/21/13  Yes Liam Graham, PA-C  diclofenac (VOLTAREN) 50 MG EC tablet Take 50 mg by mouth daily.    Yes Historical Provider, MD  HYDROcodone-acetaminophen (NORCO) 7.5-325 MG tablet Take 1 tablet by mouth 3 (three) times daily. 07/03/15  Yes Historical Provider, MD  losartan (COZAAR) 100 MG tablet Take 100 mg by mouth daily.     Yes Historical Provider, MD  metoprolol tartrate (LOPRESSOR) 25 MG tablet Take 25 mg by mouth 2 (two) times daily.   Yes Historical Provider, MD  simvastatin (ZOCOR) 20 MG tablet Take 20 mg by mouth daily.   Yes Historical Provider, MD  metoprolol (LOPRESSOR) 100 MG tablet Take 100 mg by mouth 2 (two) times daily.    Historical Provider, MD  NITROSTAT 0.4 MG SL tablet  03/16/13   Historical Provider, MD  BP 147/90 mmHg  Pulse 111  Temp(Src) 98 F (36.7 C) (Oral)  Resp 18  Ht 5\' 6"  (1.676 m)  Wt 125 lb (56.7 kg)  BMI 20.19 kg/m2  SpO2 98% Physical Exam  Constitutional: She is oriented to person, place, and time. She appears well-developed and well-nourished. No distress.  HENT:  Head: Normocephalic and atraumatic.  Mouth/Throat: Oropharynx is clear and moist.  Eyes: EOM are normal. Pupils are equal, round, and reactive to light.  Neck: Normal range of motion. Neck supple.  Cardiovascular: Normal rate and regular rhythm.  Exam reveals no friction rub.   No murmur heard. Pulmonary/Chest: Effort normal and breath sounds normal. No respiratory distress. She has no wheezes. She has no rales.  Abdominal: Soft. She  exhibits no distension. There is no tenderness. There is no rebound.  Musculoskeletal: Normal range of motion. She exhibits no edema.  Neurological: She is alert and oriented to person, place, and time.  Skin: Rash (diffuse blanching papules on trunk, bilateral upper extremities, face, neck. No signs of super infection, cellulitis..  On lower extremities, bilateral non-blanching lesions, no signs of cellulitis. No large lesions c/w purpura. Petechiae diffusely) noted. She is not diaphoretic.  Nursing note and vitals reviewed.   ED Course  Procedures (including critical care time) Labs Review Labs Reviewed  COMPREHENSIVE METABOLIC PANEL - Abnormal; Notable for the following:    CO2 20 (*)    Total Protein 8.2 (*)    All other components within normal limits  CBC - Abnormal; Notable for the following:    RBC 3.62 (*)    Hemoglobin 11.2 (*)    HCT 33.3 (*)    All other components within normal limits  LIPASE, BLOOD    Imaging Review No results found. I have personally reviewed and evaluated these images and lab results as part of my medical decision-making.   EKG Interpretation None      MDM   Final diagnoses:  Petechiae  Rash    23F with history of lupus here with rash. Began this morning. Red papular lesions on lower extremities bilaterally without blanching. No signs of infection. No easy bleeding, no blood in her stool, no SOB or CP. No gum bleeding. No new meds, no new beauty products, lotions, or soaps. She also has had a pruritic rash on her arms, back, and face for past several months. No change in this. Lesions are diffuse, the lesions blanch, and they are nontender. No weeping. No signs of cellulitis or superinfection. Here no lesions on palms or soles. No signs of cellulitis. Rash is likely erythema multiforme. No fevers, vitals stable, no concern for menigitis or RMSF. No mucosal involvement. This is could be vasculitis associated with her rheumatologic conditions.  Patient can f/u as an outpatient.    Evelina Bucy, MD 07/16/15 [phone]

## 2015-07-27 ENCOUNTER — Observation Stay (HOSPITAL_COMMUNITY)
Admission: EM | Admit: 2015-07-27 | Discharge: 2015-07-29 | Disposition: A | Discharge status: Home / Self Care | Payer: Medicare Other | Attending: Internal Medicine | Admitting: Internal Medicine

## 2015-07-27 ENCOUNTER — Encounter (HOSPITAL_COMMUNITY): Payer: Self-pay | Admitting: *Deleted

## 2015-07-27 ENCOUNTER — Emergency Department (HOSPITAL_COMMUNITY): Payer: Medicare Other

## 2015-07-27 DIAGNOSIS — M1990 Unspecified osteoarthritis, unspecified site: Secondary | ICD-10-CM | POA: Insufficient documentation

## 2015-07-27 DIAGNOSIS — IMO0002 Reserved for concepts with insufficient information to code with codable children: Secondary | ICD-10-CM | POA: Diagnosis present

## 2015-07-27 DIAGNOSIS — D72829 Elevated white blood cell count, unspecified: Secondary | ICD-10-CM | POA: Insufficient documentation

## 2015-07-27 DIAGNOSIS — K219 Gastro-esophageal reflux disease without esophagitis: Secondary | ICD-10-CM | POA: Diagnosis not present

## 2015-07-27 DIAGNOSIS — I252 Old myocardial infarction: Secondary | ICD-10-CM | POA: Insufficient documentation

## 2015-07-27 DIAGNOSIS — R197 Diarrhea, unspecified: Secondary | ICD-10-CM | POA: Diagnosis not present

## 2015-07-27 DIAGNOSIS — F419 Anxiety disorder, unspecified: Secondary | ICD-10-CM | POA: Diagnosis not present

## 2015-07-27 DIAGNOSIS — I2510 Atherosclerotic heart disease of native coronary artery without angina pectoris: Secondary | ICD-10-CM | POA: Diagnosis not present

## 2015-07-27 DIAGNOSIS — M329 Systemic lupus erythematosus, unspecified: Secondary | ICD-10-CM | POA: Insufficient documentation

## 2015-07-27 DIAGNOSIS — R1110 Vomiting, unspecified: Secondary | ICD-10-CM | POA: Diagnosis present

## 2015-07-27 DIAGNOSIS — R21 Rash and other nonspecific skin eruption: Secondary | ICD-10-CM | POA: Diagnosis not present

## 2015-07-27 DIAGNOSIS — F17210 Nicotine dependence, cigarettes, uncomplicated: Secondary | ICD-10-CM | POA: Diagnosis not present

## 2015-07-27 DIAGNOSIS — Z7982 Long term (current) use of aspirin: Secondary | ICD-10-CM | POA: Insufficient documentation

## 2015-07-27 DIAGNOSIS — R112 Nausea with vomiting, unspecified: Secondary | ICD-10-CM | POA: Diagnosis not present

## 2015-07-27 DIAGNOSIS — F1290 Cannabis use, unspecified, uncomplicated: Secondary | ICD-10-CM | POA: Insufficient documentation

## 2015-07-27 DIAGNOSIS — I10 Essential (primary) hypertension: Secondary | ICD-10-CM | POA: Diagnosis present

## 2015-07-27 DIAGNOSIS — Z23 Encounter for immunization: Secondary | ICD-10-CM | POA: Diagnosis not present

## 2015-07-27 DIAGNOSIS — R0789 Other chest pain: Secondary | ICD-10-CM | POA: Diagnosis not present

## 2015-07-27 DIAGNOSIS — Z9861 Coronary angioplasty status: Secondary | ICD-10-CM | POA: Insufficient documentation

## 2015-07-27 DIAGNOSIS — F17200 Nicotine dependence, unspecified, uncomplicated: Secondary | ICD-10-CM | POA: Diagnosis not present

## 2015-07-27 DIAGNOSIS — E860 Dehydration: Secondary | ICD-10-CM

## 2015-07-27 DIAGNOSIS — E785 Hyperlipidemia, unspecified: Secondary | ICD-10-CM | POA: Diagnosis not present

## 2015-07-27 LAB — CBC
HCT: 41.2 % (ref 36.0–46.0)
HEMOGLOBIN: 14.6 g/dL (ref 12.0–15.0)
MCH: 31.9 pg (ref 26.0–34.0)
MCHC: 35.4 g/dL (ref 30.0–36.0)
MCV: 90.2 fL (ref 78.0–100.0)
PLATELETS: 281 10*3/uL (ref 150–400)
RBC: 4.57 MIL/uL (ref 3.87–5.11)
RDW: 13.8 % (ref 11.5–15.5)
WBC: 12.2 10*3/uL — ABNORMAL HIGH (ref 4.0–10.5)

## 2015-07-27 LAB — DIFFERENTIAL
Basophils Absolute: 0 10*3/uL (ref 0.0–0.1)
Basophils Relative: 0 %
EOS ABS: 0 10*3/uL (ref 0.0–0.7)
EOS PCT: 0 %
Lymphocytes Relative: 11 %
Lymphs Abs: 1.2 10*3/uL (ref 0.7–4.0)
MONO ABS: 0.5 10*3/uL (ref 0.1–1.0)
MONOS PCT: 4 %
Neutro Abs: 9.7 10*3/uL — ABNORMAL HIGH (ref 1.7–7.7)
Neutrophils Relative %: 85 %

## 2015-07-27 LAB — I-STAT TROPONIN, ED
Troponin i, poc: 0 ng/mL (ref 0.00–0.08)
Troponin i, poc: 0 ng/mL (ref 0.00–0.08)

## 2015-07-27 LAB — BASIC METABOLIC PANEL
ANION GAP: 16 — AB (ref 5–15)
BUN: 21 mg/dL — ABNORMAL HIGH (ref 6–20)
CO2: 19 mmol/L — ABNORMAL LOW (ref 22–32)
Calcium: 10.5 mg/dL — ABNORMAL HIGH (ref 8.9–10.3)
Chloride: 110 mmol/L (ref 101–111)
Creatinine, Ser: 0.94 mg/dL (ref 0.44–1.00)
GFR calc Af Amer: >60 mL/min (ref >60)
GLUCOSE: 154 mg/dL — AB (ref 65–99)
POTASSIUM: 4 mmol/L (ref 3.5–5.1)
Sodium: 145 mmol/L (ref 135–145)

## 2015-07-27 LAB — I-STAT CG4 LACTIC ACID, ED: LACTIC ACID, VENOUS: 2.24 mmol/L — AB (ref 0.5–2.0)

## 2015-07-27 MED ORDER — ONDANSETRON 4 MG PO TBDP
4.0000 mg | ORAL_TABLET | Freq: Once | ORAL | Status: AC
  Administered 2015-07-27: 4 mg via ORAL

## 2015-07-27 MED ORDER — ONDANSETRON 4 MG PO TBDP
ORAL_TABLET | ORAL | Status: AC
  Filled 2015-07-27: qty 1

## 2015-07-27 MED ORDER — SODIUM CHLORIDE 0.9 % IV SOLN: INTRAVENOUS | Status: DC

## 2015-07-27 MED ORDER — SODIUM CHLORIDE 0.9 % IV BOLUS (SEPSIS)
1000.0000 mL | Freq: Once | INTRAVENOUS | Status: AC
  Administered 2015-07-27: 1000 mL via INTRAVENOUS

## 2015-07-27 MED ORDER — ONDANSETRON HCL 4 MG/2ML IJ SOLN: 4.0000 mg | Freq: Three times a day (TID) | INTRAMUSCULAR | Status: DC | PRN

## 2015-07-27 MED ORDER — PROMETHAZINE HCL 25 MG/ML IJ SOLN
12.5000 mg | Freq: Once | INTRAMUSCULAR | Status: AC
  Administered 2015-07-27: 12.5 mg via INTRAVENOUS
  Filled 2015-07-27: qty 1

## 2015-07-27 MED ORDER — METHYLPREDNISOLONE SODIUM SUCC 125 MG IJ SOLR
125.0000 mg | Freq: Once | INTRAMUSCULAR | Status: AC
  Administered 2015-07-28: 125 mg via INTRAVENOUS
  Filled 2015-07-27: qty 2

## 2015-07-27 MED ORDER — PREDNISONE 10 MG PO TABS
10.0000 mg | ORAL_TABLET | Freq: Every day | ORAL | Status: DC
  Administered 2015-07-28 – 2015-07-29 (×2): 10 mg via ORAL
  Filled 2015-07-27 (×2): qty 1

## 2015-07-27 NOTE — ED Provider Notes (Signed)
CSN: [id number]     Arrival date & time 07/27/15  1802 History   First MD Initiated Contact with Patient 07/27/15 2001     Chief Complaint  Patient presents with  . Chest Pain  . Lupus     (Consider location/radiation/quality/duration/timing/severity/associated sxs/prior Treatment) Patient is a 54 y.o. female presenting with chest pain. The history is provided by the patient. No language interpreter was used.  Chest Pain Pain location:  Substernal area Pain quality: sharp   Pain radiates to the back: no   Associated symptoms: abdominal pain, nausea, vomiting and weakness   Associated symptoms: no cough, no fever and no shortness of breath   Associated symptoms comment:  Patient has been having nausea and vomiting throughout today without known fever. Non-bloody emesis. Diarrhea for the past month, also non-bloody, approximately 10 episodes daily. She has cramping abdominal pain associated with bowel movements. She states history of lupus and N, V and diaphoresis are similar to previous lupus flares. She does not take anything on a daily basis. Chest pain started after arrival to the emergency department and is described as sharp, constant and central. She reports a history of cardiac disease with stent placement. Current pain is dissimilar to cardiac pain.   Past Medical History  Diagnosis Date  . Hypertension   . Coronary artery disease   . Lupus ([place])   . Myocardial infarction ([place]) 2009  . Hyperlipidemia   . Anxiety   . Arthritis     rheumatoid   Past Surgical History  Procedure Laterality Date  . Mastectomy     Family History  Problem Relation Age of Onset  . Stomach cancer    . Breast cancer    . Heart disease    . Stomach cancer Mother   . Breast cancer Sister   . Brain cancer Brother    Social History  Substance Use Topics  . Smoking status: Current Every Day Smoker -- 0.25 packs/day  . Smokeless tobacco: Never Used  . Alcohol Use: Yes   OB History    Gravida Para Term Preterm AB TAB SAB Ectopic Multiple Living   4 2 2  0 2 2 0 0 0 2     Review of Systems  Constitutional: Negative for fever and chills.  HENT: Negative.   Respiratory: Negative.  Negative for cough and shortness of breath.   Cardiovascular: Positive for chest pain.  Gastrointestinal: Positive for nausea, vomiting, abdominal pain and diarrhea. Negative for blood in stool.  Musculoskeletal: Negative.  Negative for myalgias.  Skin: Positive for rash.  Neurological: Positive for weakness.      Allergies  Review of patient's allergies indicates no known allergies.  Home Medications   Prior to Admission medications   Medication Sig Start Date End Date Taking? Authorizing Provider  ALPRAZolam (XANAX) 0.25 MG tablet Take 0.25 mg by mouth daily as needed. For when nervous    Yes Historical Provider, MD  aspirin EC 81 MG tablet Take 1 tablet (81 mg total) by mouth daily. 09/21/13  Yes Liam Graham, PA-C  diclofenac (VOLTAREN) 50 MG EC tablet Take 50 mg by mouth daily.    Yes Historical Provider, MD  HYDROcodone-acetaminophen (NORCO) 7.5-325 MG tablet Take 1 tablet by mouth 3 (three) times daily. 07/03/15  Yes Historical Provider, MD  hydrOXYzine (ATARAX/VISTARIL) 25 MG tablet Take 1 tablet (25 mg total) by mouth every 6 (six) hours. 07/16/15  Yes Evelina Bucy, MD  losartan (COZAAR) 100 MG tablet Take 100 mg by  mouth daily.     Yes Historical Provider, MD  metoprolol (LOPRESSOR) 100 MG tablet Take 100 mg by mouth 2 (two) times daily. Patient states she is taking a 25mg  along with the 100mg  tablet per patient   Yes Historical Provider, MD  metoprolol tartrate (LOPRESSOR) 25 MG tablet Take 25 mg by mouth 2 (two) times daily. Patient states she is also taking a 100mg  tablet along with the 25mg  per patient   Yes Historical Provider, MD  NITROSTAT 0.4 MG SL tablet  03/16/13  Yes Historical Provider, MD  simvastatin (ZOCOR) 20 MG tablet Take 20 mg by mouth daily.   Yes Historical  Provider, MD   BP 153/103 mmHg  Pulse 85  Temp(Src) 97.4 F (36.3 C) (Oral)  Resp 21  SpO2 99% Physical Exam  Constitutional: She is oriented to person, place, and time. She appears well-developed and well-nourished.  HENT:  Head: Normocephalic.  Eyes: Conjunctivae are normal.  Neck: Normal range of motion. Neck supple.  Cardiovascular: Normal rate and regular rhythm.   Pulmonary/Chest: Effort normal and breath sounds normal.  Abdominal: Soft. Bowel sounds are normal. There is no tenderness. There is no rebound and no guarding.  Musculoskeletal: Normal range of motion.  Neurological: She is alert and oriented to person, place, and time. A cranial nerve deficit is present.  Skin: Skin is warm. No rash noted. She is diaphoretic.  Red, non-raised facial rash without appearance of secondary infection.   Psychiatric: She has a normal mood and affect.    ED Course  Procedures (including critical care time) Labs Review Labs Reviewed  BASIC METABOLIC PANEL - Abnormal; Notable for the following:    CO2 19 (*)    Glucose, Bld 154 (*)    BUN 21 (*)    Calcium 10.5 (*)    Anion gap 16 (*)    All other components within normal limits  CBC - Abnormal; Notable for the following:    WBC 12.2 (*)    All other components within normal limits  DIFFERENTIAL  Randolm Idol, ED    Imaging Review Dg Chest 2 View  07/27/2015  CLINICAL DATA:  Centralized chest pain since this morning. EXAM: CHEST  2 VIEW COMPARISON:  May 05, 2010 FINDINGS: The heart size and mediastinal contours are within normal limits. There is no focal infiltrate, pulmonary edema, or pleural effusion. The visualized skeletal structures are unremarkable. IMPRESSION: No active cardiopulmonary disease. Electronically Signed   By: Abelardo Diesel M.D.   On: 07/27/2015 18:55   I have personally reviewed and evaluated these images and lab results as part of my medical decision-making.   EKG Interpretation   Date/Time:   Thursday July 27 2015 18:11:47 EST Ventricular Rate:  84 PR Interval:  114 QRS Duration: 68 QT Interval:  390 QTC Calculation: 460 R Axis:   70 Text Interpretation:  Normal sinus rhythm Right atrial enlargement  Borderline ECG Baseline wander Confirmed by BEATON  MD, ROBERT ([id number]) on  07/27/2015 8:12:13 PM      MDM   Final diagnoses:  None    1. Lupus 2. Vomiting, intractable  She is given IV fluids and Zofran/Phenergan but continues to have nausea with vomiting on PO challenge. She has an anion gap and a slightly increased lactic acid, suggesting dehydration. Solumedrol provided for suspect lupus involvement. Cardiac evaluation, including delta troponin is negative - doubt cardiac cause of vomiting and diaphoresis.   Discussed with Dr. Tamala Julian of [facility] who accepts the patient for  observation admission and rehydration.    Charlann Lange, PA-C 07/27/15 [id number]  Leonard Schwartz, MD 07/27/15 2352

## 2015-07-27 NOTE — ED Notes (Signed)
Per EMS- pt has had a lupus flare up for 1 week with n/v/diaphorsis. Pt states that this is normal for her flare ups. Pt states that she began having central sharp chest pain with is not typical for her. This has been ongoing for 1 hour.

## 2015-07-27 NOTE — Progress Notes (Signed)
Attempted call for report.

## 2015-07-27 NOTE — H&P (Addendum)
[facility] History and Physical  [name] MRN:[id number] DOB: 11/19/1960 DOA: 07/27/2015  Referring physician: ED PCP: GARBA,LAWAL, MD   Chief Complaint: "I've been throwing up all day"  HPI:  54-year-old female with a past medical history significant for lupus, CAD s/p ent placement, arthritis, hyperlipidemia, hypertension, history of MI; who presents for intractable nausea vomiting. Patient notes that symptoms started this morning and has not been able to keep anything down at home. She notes that she did not have anything to try to stop symptoms at home.She may have vomited at least 30 times since the beginning of today. Denies any recent antibiotics, travel, sick contacts. She's had associated symptoms of fever, chills, sweats, and chest pain. Chest pain is in the middle of her chest and intermittent. [name] also discusses issues with a rash and diarrhea. The rash has been going on for some time now and is mostly on her face and back at this time. A few weeks ago it was on her legs but that resolved after some steroid cream. Diarrhea symptoms  Have been ongoing  approximately 1 month now. She states that she's had to have multiple hospitalizations in the past for similar symptoms  Following the emergency department patient was treated with Phenergan,  IV fluids, and 125 mg of Solu-Medrol. Initial troponins and EKG were seen to be within normal limits.   Review of Systems  Constitutional: Positive for fever, chills, malaise/fatigue and diaphoresis.  HENT: Negative for congestion and nosebleeds.   Eyes: Negative for double vision and photophobia.  Respiratory: Negative for cough and shortness of breath.   Cardiovascular: Positive for chest pain. Negative for palpitations.  Gastrointestinal: Positive for nausea, vomiting and diarrhea.  Genitourinary: Negative for dysuria and urgency.  Musculoskeletal: Positive for myalgias and joint pain.  Skin: Positive for itching and  rash.  Neurological: Negative for focal weakness, seizures and loss of consciousness.  Endo/Heme/Allergies: Negative for polydipsia. Bruises/bleeds easily.  Psychiatric/Behavioral: The patient is nervous/anxious.         Past Medical History  Diagnosis Date  . Hypertension   . Coronary artery disease   . Lupus (HCC)   . Myocardial infarction (HCC) 2009  . Hyperlipidemia   . Anxiety   . Arthritis     rheumatoid     Past Surgical History  Procedure Laterality Date  . Mastectomy        Social History:  reports that she has been smoking.  She has never used smokeless tobacco. She reports that she drinks alcohol. She reports that she uses illicit drugs (Marijuana). where does patient live--home Can patient participate in ADLs? Yes  No Known Allergies  Family History  Problem Relation Age of Onset  . Stomach cancer    . Breast cancer    . Heart disease    . Stomach cancer Mother   . Breast cancer Sister   . Brain cancer Brother       Prior to Admission medications   Medication Sig Start Date End Date Taking? Authorizing Provider  ALPRAZolam (XANAX) 0.25 MG tablet Take 0.25 mg by mouth daily as needed. For when nervous    Yes Historical Provider, MD  aspirin EC 81 MG tablet Take 1 tablet (81 mg total) by mouth daily. 09/21/13  Yes Zachary H Baker, PA-C  diclofenac (VOLTAREN) 50 MG EC tablet Take 50 mg by mouth daily.    Yes Historical Provider, MD  HYDROcodone-acetaminophen (NORCO) 7.5-325 MG tablet Take 1 tablet by mouth 3 (three) times   daily. 07/03/15  Yes Historical Provider, MD  hydrOXYzine (ATARAX/VISTARIL) 25 MG tablet Take 1 tablet (25 mg total) by mouth every 6 (six) hours. 07/16/15  Yes Blair Walden, MD  losartan (COZAAR) 100 MG tablet Take 100 mg by mouth daily.     Yes Historical Provider, MD  metoprolol (LOPRESSOR) 100 MG tablet Take 100 mg by mouth 2 (two) times daily. Patient states she is taking a 25mg along with the 100mg tablet per patient   Yes  Historical Provider, MD  metoprolol tartrate (LOPRESSOR) 25 MG tablet Take 25 mg by mouth 2 (two) times daily. Patient states she is also taking a 100mg tablet along with the 25mg per patient   Yes Historical Provider, MD  NITROSTAT 0.4 MG SL tablet  03/16/13  Yes Historical Provider, MD  simvastatin (ZOCOR) 20 MG tablet Take 20 mg by mouth daily.   Yes Historical Provider, MD     Physical Exam: Filed Vitals:   07/27/15 1809 07/27/15 1813 07/27/15 2016  BP:  151/117 153/103  Pulse:  99 85  Temp: 98.2 F (36.8 C)  97.4 F (36.3 C)  TempSrc: Oral  Oral  Resp:   21  SpO2:  99% 99%    Constitutional: Vital signs reviewed. Patient is ill appearing and some mild distress curled up in the hospital bed. Otherwise is alert and oriented x3.  Head: Normocephalic and atraumatic  Ear: TM normal bilaterally  Mouth: no erythema or exudates, dry mucous membranes with vomitus on the side of mouth Eyes: PERRL, EOMI, conjunctivae normal, No scleral icterus.  Neck: Supple, Trachea midline normal ROM, No JVD, mass, thyromegaly, or carotid bruit present.  Cardiovascular: RRR, S1 normal, S2 normal, no MRG, pulses symmetric and intact bilaterally  Pulmonary/Chest: CTAB, no wheezes, rales, or rhonchi  Abdominal: Generalized abdominal tenderness no significant guarding or peritoneal signs. Positive bowel sounds in all 4 quadrants GU: no CVA tenderness Musculoskeletal: No joint deformities, erythema, or stiffness, ROM full and no nontender Ext: no edema and no cyanosis, pulses palpable bilaterally (DP and PT)  Hematology: no cervical, inginal, or axillary adenopathy.  Neurological: A&O x3, Strenght is normal and symmetric bilaterally, cranial nerve II-XII are grossly intact, no focal motor deficit, sensory intact to light touch bilaterally.  Skin: Mildly diaphoretic and warm with a scattered macular papular rash across the face and back.  Psychiatric: Anxious mood and affect. speech and behavior is normal.  Judgment and thought content normal. Cognition and memory are normal.      Data Review   Micro Results No results found for this or any previous visit (from the past 240 hour(s)).  Radiology Reports Dg Chest 2 View  07/27/2015  CLINICAL DATA:  Centralized chest pain since this morning. EXAM: CHEST  2 VIEW COMPARISON:  May 05, 2010 FINDINGS: The heart size and mediastinal contours are within normal limits. There is no focal infiltrate, pulmonary edema, or pleural effusion. The visualized skeletal structures are unremarkable. IMPRESSION: No active cardiopulmonary disease. Electronically Signed   By: Wei-Chen  Lin M.D.   On: 07/27/2015 18:55     CBC  Recent Labs Lab 07/27/15 1838  WBC 12.2*  HGB 14.6  HCT 41.2  PLT 281  MCV 90.2  MCH 31.9  MCHC 35.4  RDW 13.8  LYMPHSABS 1.2  MONOABS 0.5  EOSABS 0.0  BASOSABS 0.0    Chemistries   Recent Labs Lab 07/27/15 1838  NA 145  K 4.0  CL 110  CO2 19*  GLUCOSE 154*  BUN 21*    CREATININE 0.94  CALCIUM 10.5*   ------------------------------------------------------------------------------------------------------------------ estimated creatinine clearance is 61.2 mL/min (by C-G formula based on Cr of 0.94). ------------------------------------------------------------------------------------------------------------------ No results for input(s): HGBA1C in the last 72 hours. ------------------------------------------------------------------------------------------------------------------ No results for input(s): CHOL, HDL, LDLCALC, TRIG, CHOLHDL, LDLDIRECT in the last 72 hours. ------------------------------------------------------------------------------------------------------------------ No results for input(s): TSH, T4TOTAL, T3FREE, THYROIDAB in the last 72 hours.  Invalid input(s): FREET3 ------------------------------------------------------------------------------------------------------------------ No results for  input(s): VITAMINB12, FOLATE, FERRITIN, TIBC, IRON, RETICCTPCT in the last 72 hours.  Coagulation profile No results for input(s): INR, PROTIME in the last 168 hours.  No results for input(s): DDIMER in the last 72 hours.  Cardiac Enzymes No results for input(s): CKMB, TROPONINI, MYOGLOBIN in the last 168 hours.  Invalid input(s): CK ------------------------------------------------------------------------------------------------------------------ Invalid input(s): POCBNP   CBG: No results for input(s): GLUCAP in the last 168 hours.     EKG: Independently reviewed. Normal sinus rhythm with right atrial enlargement QTC is 460.   Assessment/Plan Principal Problem:  Intractable vomiting with nausea: Acute. Patient notes previous history similar symptoms with acute lupus flare. On differential also include differential includes gastroenteritis - Checking urinalysis/abd xray -IV/po Zofran and Phenergan  -IV fluids normal saline -Clear liquids advance diet as tolerated  Lupus ([place]): Suspect possible acute flare as seen above. Patient received  Solu-Medrol 125 mg and ED. -Checking ESR/ CRP -Instituted 10 mg prednisone if this is indeed a lupus flare  Leukocytosis: Acute patient denies any recent history of steroid use. The acute stress response versus infection  -Recheck CBC   Essential hypertension -Continue  Cozaar and metoprolol Atypical chest pain: EKG showing normal sinus rhythm with right atrial enlargement. Negative troponins 2 -Continue to monitor -Nitroglycerin as needed   CAD s/p PCI /MYOCARDIAL INFARCTION, HX OF: Stable -Continue aspirin and simvastatin   Diarrhea: Chronic as patient states symptoms have been going on for over a month now. -Monitor see if patient is actually having diarrhea -Strict ins and outs   Anxiety: -prn Xanax  Rash: Patient noted some exposure rash on face and back. Suspect secondary to patient's history of lupus -Hydroxyzine for  itching  Tobacco abuse: Patient has smoked for many years.  -Counseling patient about the importance of quitting smoking.  -Nicotine patch offered  Arthritis: stable. Associated most likely with patient's history of lupus -Continue diclofenac  GERD -protonix  Hypercalcemia: Calcium 10.5  -recheck BMP workup. If remains elevated  Code Status:   full Family Communication: bedside Disposition Plan: admit   Total time spent 55 minutes.Greater than 50% of this time was spent in counseling, explanation of diagnosis, planning of further management, and coordination of care  [facility] Pager [phone]  If 7PM-7AM, please contact night-coverage [email or web address] Password [email or web address] 07/27/2015, 11:38 PM

## 2015-07-27 NOTE — ED Notes (Signed)
Attempted to give report 

## 2015-07-28 ENCOUNTER — Observation Stay (HOSPITAL_COMMUNITY): Payer: Medicare Other

## 2015-07-28 ENCOUNTER — Encounter (HOSPITAL_COMMUNITY): Payer: Self-pay

## 2015-07-28 DIAGNOSIS — A09 Infectious gastroenteritis and colitis, unspecified: Secondary | ICD-10-CM | POA: Diagnosis not present

## 2015-07-28 DIAGNOSIS — R0789 Other chest pain: Secondary | ICD-10-CM | POA: Diagnosis not present

## 2015-07-28 DIAGNOSIS — E860 Dehydration: Secondary | ICD-10-CM | POA: Diagnosis not present

## 2015-07-28 DIAGNOSIS — G43A1 Cyclical vomiting, intractable: Secondary | ICD-10-CM | POA: Diagnosis not present

## 2015-07-28 LAB — URINALYSIS, ROUTINE W REFLEX MICROSCOPIC
Bilirubin Urine: NEGATIVE
GLUCOSE, UA: NEGATIVE mg/dL
KETONES UR: 15 mg/dL — AB
LEUKOCYTES UA: NEGATIVE
Nitrite: NEGATIVE
PH: 5.5 (ref 5.0–8.0)
Protein, ur: 30 mg/dL — AB
Specific Gravity, Urine: 1.017 (ref 1.005–1.030)
Urobilinogen, UA: 0.2 mg/dL (ref 0.0–1.0)

## 2015-07-28 LAB — COMPREHENSIVE METABOLIC PANEL
ALT: 28 U/L (ref 14–54)
AST: 30 U/L (ref 15–41)
Albumin: 4.4 g/dL (ref 3.5–5.0)
Alkaline Phosphatase: 88 U/L (ref 38–126)
Anion gap: 12 (ref 5–15)
BILIRUBIN TOTAL: 0.6 mg/dL (ref 0.3–1.2)
BUN: 18 mg/dL (ref 6–20)
CALCIUM: 9.1 mg/dL (ref 8.9–10.3)
CO2: 21 mmol/L — ABNORMAL LOW (ref 22–32)
CREATININE: 0.86 mg/dL (ref 0.44–1.00)
Chloride: 110 mmol/L (ref 101–111)
GFR calc Af Amer: >60 mL/min (ref >60)
Glucose, Bld: 137 mg/dL — ABNORMAL HIGH (ref 65–99)
POTASSIUM: 3.8 mmol/L (ref 3.5–5.1)
Sodium: 143 mmol/L (ref 135–145)
TOTAL PROTEIN: 9.2 g/dL — AB (ref 6.5–8.1)

## 2015-07-28 LAB — RAPID URINE DRUG SCREEN, HOSP PERFORMED
Amphetamines: NOT DETECTED
BARBITURATES: NOT DETECTED
Benzodiazepines: NOT DETECTED
Cocaine: NOT DETECTED
Opiates: NOT DETECTED
TETRAHYDROCANNABINOL: POSITIVE — AB

## 2015-07-28 LAB — CBC
HEMATOCRIT: 34.8 % — AB (ref 36.0–46.0)
Hemoglobin: 11.8 g/dL — ABNORMAL LOW (ref 12.0–15.0)
MCH: 30.9 pg (ref 26.0–34.0)
MCHC: 33.9 g/dL (ref 30.0–36.0)
MCV: 91.1 fL (ref 78.0–100.0)
PLATELETS: 276 10*3/uL (ref 150–400)
RBC: 3.82 MIL/uL — ABNORMAL LOW (ref 3.87–5.11)
RDW: 13.9 % (ref 11.5–15.5)
WBC: 7.1 10*3/uL (ref 4.0–10.5)

## 2015-07-28 LAB — C-REACTIVE PROTEIN: CRP: 1.3 mg/dL — ABNORMAL HIGH (ref <1.0)

## 2015-07-28 LAB — URINE MICROSCOPIC-ADD ON

## 2015-07-28 LAB — TSH: TSH: 0.683 u[IU]/mL (ref 0.350–4.500)

## 2015-07-28 LAB — SEDIMENTATION RATE: Sed Rate: 68 mm/hr — ABNORMAL HIGH (ref 0–22)

## 2015-07-28 LAB — MAGNESIUM: MAGNESIUM: 1.9 mg/dL (ref 1.7–2.4)

## 2015-07-28 MED ORDER — LABETALOL HCL 5 MG/ML IV SOLN
10.0000 mg | INTRAVENOUS | Status: DC | PRN
  Filled 2015-07-28: qty 4

## 2015-07-28 MED ORDER — LOSARTAN POTASSIUM 50 MG PO TABS
100.0000 mg | ORAL_TABLET | Freq: Every day | ORAL | Status: DC
  Administered 2015-07-28 – 2015-07-29 (×2): 100 mg via ORAL
  Filled 2015-07-28 (×2): qty 2

## 2015-07-28 MED ORDER — HYDROXYZINE HCL 25 MG PO TABS: 25.0000 mg | ORAL_TABLET | Freq: Four times a day (QID) | ORAL | Status: DC | PRN

## 2015-07-28 MED ORDER — SODIUM CHLORIDE 0.9 % IV SOLN
INTRAVENOUS | Status: DC
  Administered 2015-07-28: 10:00:00 via INTRAVENOUS

## 2015-07-28 MED ORDER — SIMVASTATIN 20 MG PO TABS
20.0000 mg | ORAL_TABLET | Freq: Every day | ORAL | Status: DC
  Administered 2015-07-28 – 2015-07-29 (×2): 20 mg via ORAL
  Filled 2015-07-28 (×2): qty 1

## 2015-07-28 MED ORDER — ALPRAZOLAM 0.25 MG PO TABS: 0.2500 mg | ORAL_TABLET | Freq: Three times a day (TID) | ORAL | Status: DC | PRN

## 2015-07-28 MED ORDER — METOPROLOL TARTRATE 50 MG PO TABS
125.0000 mg | ORAL_TABLET | Freq: Two times a day (BID) | ORAL | Status: DC
  Filled 2015-07-28: qty 3

## 2015-07-28 MED ORDER — BOOST / RESOURCE BREEZE PO LIQD
1.0000 | Freq: Three times a day (TID) | ORAL | Status: DC
Start: 2015-07-28 — End: 2015-07-29
  Administered 2015-07-28 – 2015-07-29 (×4): 1 via ORAL

## 2015-07-28 MED ORDER — ASPIRIN EC 81 MG PO TBEC
81.0000 mg | DELAYED_RELEASE_TABLET | Freq: Every day | ORAL | Status: DC
  Administered 2015-07-28 – 2015-07-29 (×2): 81 mg via ORAL
  Filled 2015-07-28 (×2): qty 1

## 2015-07-28 MED ORDER — ONDANSETRON HCL 4 MG PO TABS: 4.0000 mg | ORAL_TABLET | Freq: Four times a day (QID) | ORAL | Status: DC | PRN

## 2015-07-28 MED ORDER — CIPROFLOXACIN IN D5W 400 MG/200ML IV SOLN
400.0000 mg | Freq: Two times a day (BID) | INTRAVENOUS | Status: DC
  Administered 2015-07-28 – 2015-07-29 (×2): 400 mg via INTRAVENOUS
  Filled 2015-07-28 (×2): qty 200

## 2015-07-28 MED ORDER — NITROGLYCERIN 0.4 MG SL SUBL: 0.4000 mg | SUBLINGUAL_TABLET | SUBLINGUAL | Status: DC | PRN

## 2015-07-28 MED ORDER — DICLOFENAC SODIUM 50 MG PO TBEC
50.0000 mg | DELAYED_RELEASE_TABLET | Freq: Every day | ORAL | Status: DC
  Administered 2015-07-28 – 2015-07-29 (×2): 50 mg via ORAL
  Filled 2015-07-28 (×3): qty 1

## 2015-07-28 MED ORDER — METOPROLOL TARTRATE 100 MG PO TABS
100.0000 mg | ORAL_TABLET | Freq: Two times a day (BID) | ORAL | Status: DC
  Administered 2015-07-28 – 2015-07-29 (×2): 100 mg via ORAL
  Filled 2015-07-28: qty 2
  Filled 2015-07-28: qty 1

## 2015-07-28 MED ORDER — IOHEXOL 300 MG/ML  SOLN
100.0000 mL | Freq: Once | INTRAMUSCULAR | Status: AC | PRN
  Administered 2015-07-28: 100 mL via INTRAVENOUS

## 2015-07-28 MED ORDER — ENOXAPARIN SODIUM 40 MG/0.4ML ~~LOC~~ SOLN
40.0000 mg | Freq: Every day | SUBCUTANEOUS | Status: DC
  Administered 2015-07-28 – 2015-07-29 (×2): 40 mg via SUBCUTANEOUS
  Filled 2015-07-28 (×2): qty 0.4

## 2015-07-28 MED ORDER — HYDROCODONE-ACETAMINOPHEN 7.5-325 MG PO TABS
1.0000 | ORAL_TABLET | Freq: Three times a day (TID) | ORAL | Status: DC | PRN
  Administered 2015-07-28: 1 via ORAL
  Filled 2015-07-28: qty 1

## 2015-07-28 MED ORDER — PANTOPRAZOLE SODIUM 40 MG PO TBEC
40.0000 mg | DELAYED_RELEASE_TABLET | Freq: Every day | ORAL | Status: DC
  Administered 2015-07-28 – 2015-07-29 (×2): 40 mg via ORAL
  Filled 2015-07-28 (×2): qty 1

## 2015-07-28 MED ORDER — ONDANSETRON HCL 4 MG/2ML IJ SOLN
4.0000 mg | Freq: Four times a day (QID) | INTRAMUSCULAR | Status: DC | PRN
  Administered 2015-07-28 (×2): 4 mg via INTRAVENOUS
  Filled 2015-07-28 (×2): qty 2

## 2015-07-28 MED ORDER — METRONIDAZOLE IN NACL 5-0.79 MG/ML-% IV SOLN
500.0000 mg | Freq: Three times a day (TID) | INTRAVENOUS | Status: DC
  Administered 2015-07-28 – 2015-07-29 (×3): 500 mg via INTRAVENOUS
  Filled 2015-07-28 (×3): qty 100

## 2015-07-28 MED ORDER — PNEUMOCOCCAL VAC POLYVALENT 25 MCG/0.5ML IJ INJ
0.5000 mL | INJECTION | INTRAMUSCULAR | Status: AC
  Administered 2015-07-29: 0.5 mL via INTRAMUSCULAR
  Filled 2015-07-28: qty 0.5

## 2015-07-28 MED ORDER — METOPROLOL TARTRATE 1 MG/ML IV SOLN
7.5000 mg | Freq: Four times a day (QID) | INTRAVENOUS | Status: DC
  Administered 2015-07-28 (×3): 7.5 mg via INTRAVENOUS
  Filled 2015-07-28 (×3): qty 10

## 2015-07-28 MED ORDER — METOPROLOL TARTRATE 25 MG PO TABS: 25.0000 mg | ORAL_TABLET | Freq: Two times a day (BID) | ORAL | Status: DC

## 2015-07-28 NOTE — Progress Notes (Signed)
Nutrition Brief Note  Patient identified on the Malnutrition Screening Tool (MST) Report  Wt Readings from Last 15 Encounters:  07/28/15 126 lb 1.7 oz (57.2 kg)  07/16/15 125 lb (56.7 kg)  10/12/14 133 lb 9.6 oz (60.601 kg)  09/19/13 120 lb (54.432 kg)  04/26/13 122 lb (55.339 kg)  04/01/13 122 lb 3.2 oz (55.43 kg)  11/22/11 120 lb (54.432 kg)  02/16/09 132 lb 0.5 oz (59.889 kg)  12/08/08 134 lb 11.2 oz (61.1 kg)  11/17/08 135 lb (61.236 kg)  08/04/08 139 lb 6.4 oz (63.231 kg)  03/03/08 141 lb 8 oz (64.184 kg)  01/07/07 142 lb 1.6 oz (64.70 kg)   54 year old female with a past medical history significant for lupus, CAD s/p ent placement, arthritis, hyperlipidemia, hypertension, history of MI; who presents for intractable nausea vomiting. Patient notes that symptoms started this morning and has not been able to keep anything down at home. She notes that she did not have anything to try to stop symptoms at home.She may have vomited at least 30 times since the beginning of today. Denies any recent antibiotics, travel, sick contacts. She's had associated symptoms of fever, chills, sweats, and chest pain. Chest pain is in the middle of her chest and intermittent. Ms. Stitz also discusses issues with a rash and diarrhea. The rash has been going on for some time now and is mostly on her face and back at this time. A few weeks ago it was on her legs but that resolved after some steroid cream. Diarrhea symptoms Have been ongoing approximately 1 month now. She states that she's had to have multiple hospitalizations in the past for similar symptoms  Pt was using bathroom at time of visit. Reviewed wt hx. Noted hx of distant wt loss. Wt has ranged between 120-130# within the past 2 years.  Body mass index is 20.36 kg/(m^2). Patient meets criteria for normal weight range based on current BMI.   Current diet order is full liquid, patient is consuming approximately 50-100% of meals at this time. Labs  and medications reviewed.   No nutrition interventions warranted at this time. If nutrition issues arise, please consult RD.   Lagina Reader A. Jimmye Norman, RD, LDN, CDE Pager: [phone] After hours Pager: [phone]

## 2015-07-28 NOTE — Progress Notes (Signed)
Received report from Anderson Malta, [place] in ED.

## 2015-07-28 NOTE — Progress Notes (Signed)
Contact lab who stated we can use urine sample collected last night which is saved in lab currently for other urine labs to be collected.Order requisitions sent down to lab.

## 2015-07-28 NOTE — Progress Notes (Addendum)
[facility] PROGRESS NOTE  Kafi Kezer [id number] DOB: 06/15/1961 DOA: 07/27/2015 PCP: Barbette Merino, MD  Length of stay:    Assessment/Plan: Principal Problem:   Intractable vomiting with nausea Active Problems:   TOBACCO ABUSE   Essential hypertension   MYOCARDIAL INFARCTION, HX OF   GERD   Lupus (HCC)   Diarrhea   Atypical chest pain   Hypercalcemia   Dehydration     Intractable vomiting with nausea: Likely secondary to marijuana use, vs infectious colitis CT scan of the abdomen and pelvis showed sigmoid colitis , started on abx due to suspected infectious diarrhea   concern for  lupus flare in the setting of  elevated lactate and CRP Previous UDS has been positive for marijuana,. On differential also include differential includes gastroenteritis No diarrhea since admission  continue iV/po Zofran and Phenergan  -Continue IV fluids normal saline Advance to full liquid diet   History of Lupus ([place]): Doubt acute flare , CT abdomen pelvis ordered to rule it out. No indication for continuation of IV  steroids   Leukocytosis: Acute patient denies any recent history of steroid use. The acute stress response versus infection White count normal this morning  Essential hypertension-uncontrolled -Continue Cozaar , restart metoprolol, use prn hydralazine for systolic greater than 54 Atypical chest pain: EKG showing normal sinus rhythm with right atrial enlargement. Negative troponins 2   CAD s/p PCI /MYOCARDIAL INFARCTION, HX OF: Stable -Continue aspirin and simvastatin Cardiac enzymes negative  Diarrhea: Chronic as patient states symptoms have been going on for over a month now. -Monitor see if patient is actually having diarrhea Stool studies will be sent if  the patient has diarrhea   Anxiety: -prn Xanax  Rash: Patient noted some exposure rash on face and back. Suspect secondary to patient's history of lupus -Hydroxyzine for  itching  Tobacco abuse: Patient has smoked for many years.  -Counseling patient about the importance of quitting smoking.  -Nicotine patch offered  Arthritis: stable. Associated most likely with patient's history of lupus -Continue diclofenac  GERD -protonix  Hypercalcemia: Calcium 10.5 , now 9.1, improved with IV hydration    DVT prophylaxsis Lovenox  Code Status:      Code Status Orders        Start     Ordered   07/28/15 0044  Full code   Continuous     07/28/15 0043     Family Communication: family updated about patient's clinical progress Disposition Plan:   Anticipate discharge in one to 2 days   Brief narrative: 54 year old female with a past medical history significant for lupus, CAD s/p ent placement, arthritis, hyperlipidemia, hypertension, history of MI; who presents for intractable nausea vomiting. Patient notes that symptoms started this morning and has not been able to keep anything down at home. She notes that she did not have anything to try to stop symptoms at home.She may have vomited at least 30 times since the beginning of today. Denies any recent antibiotics, travel, sick contacts. She's had associated symptoms of fever, chills, sweats, and chest pain. Chest pain is in the middle of her chest and intermittent. Ms. Clothier also discusses issues with a rash and diarrhea. The rash has been going on for some time now and is mostly on her face and back at this time. A few weeks ago it was on her legs but that resolved after some steroid cream. Diarrhea symptoms Have been ongoing approximately 1 month now. She states that she's  had to have multiple hospitalizations in the past for similar symptoms  Following the emergency department patient was treated with Phenergan, IV fluids, and 125 mg of Solu-Medrol. Initial troponins and EKG were seen to be within normal limits.  Consultants:  None  Procedures:  CT scan  Antibiotics: Anti-infectives    None          HPI/Subjective: Patient states that she has had some improvement in her nausea and vomiting, tolerating clear liquids  Objective: Filed Vitals:   07/28/15 0040 07/28/15 0253 07/28/15 0528 07/28/15 0627  BP: 194/97 167/73 195/92 192/73  Pulse: 72 56 68 62  Temp:   98.4 F (36.9 C)   TempSrc:   Oral   Resp:   20   Height:      Weight:      SpO2: 100%  100%     Intake/Output Summary (Last 24 hours) at 07/28/15 1227 Last data filed at 07/28/15 1104  Gross per 24 hour  Intake   2600 ml  Output   1200 ml  Net   1400 ml    Exam:  General: No acute respiratory distress Lungs: Clear to auscultation bilaterally without wheezes or crackles Cardiovascular: Regular rate and rhythm without murmur gallop or rub normal S1 and S2 Abdomen: Nontender, nondistended, soft, bowel sounds positive, no rebound, no ascites, no appreciable mass Extremities: No significant cyanosis, clubbing, or edema bilateral lower extremities     Data Review   Micro Results No results found for this or any previous visit (from the past 240 hour(s)).  Radiology Reports Dg Chest 2 View  07/27/2015  CLINICAL DATA:  Centralized chest pain since this morning. EXAM: CHEST  2 VIEW COMPARISON:  May 05, 2010 FINDINGS: The heart size and mediastinal contours are within normal limits. There is no focal infiltrate, pulmonary edema, or pleural effusion. The visualized skeletal structures are unremarkable. IMPRESSION: No active cardiopulmonary disease. Electronically Signed   By: Abelardo Diesel M.D.   On: 07/27/2015 18:55   Dg Abd 2 Views  07/28/2015  CLINICAL DATA:  Nausea and vomiting for 1 week EXAM: ABDOMEN - 2 VIEW COMPARISON:  November 22, 2011 FINDINGS: Supine and upright images were obtained. There is moderate stool in the colon. There is no bowel dilatation or air-fluid level suggesting obstruction. No free air. Lung bases are clear. There are phleboliths the pelvis. There is focal calcification in the  abdominal aorta. IMPRESSION: Bowel gas pattern unremarkable. Moderate stool in colon. Lung bases clear. Electronically Signed   By: Lowella Grip III M.D.   On: 07/28/2015 07:22     CBC  Recent Labs Lab 07/27/15 1838 07/28/15 0524  WBC 12.2* 7.1  HGB 14.6 11.8*  HCT 41.2 34.8*  PLT 281 276  MCV 90.2 91.1  MCH 31.9 30.9  MCHC 35.4 33.9  RDW 13.8 13.9  LYMPHSABS 1.2  --   MONOABS 0.5  --   EOSABS 0.0  --   BASOSABS 0.0  --     Chemistries   Recent Labs Lab 07/27/15 1838 07/28/15 0524  NA 145 143  K 4.0 3.8  CL 110 110  CO2 19* 21*  GLUCOSE 154* 137*  BUN 21* 18  CREATININE 0.94 0.86  CALCIUM 10.5* 9.1  AST  --  30  ALT  --  28  ALKPHOS  --  88  BILITOT  --  0.6   ------------------------------------------------------------------------------------------------------------------ estimated creatinine clearance is 67.5 mL/min (by C-G formula based on Cr of 0.86). ------------------------------------------------------------------------------------------------------------------ No results  for input(s): HGBA1C in the last 72 hours. ------------------------------------------------------------------------------------------------------------------ No results for input(s): CHOL, HDL, LDLCALC, TRIG, CHOLHDL, LDLDIRECT in the last 72 hours. ------------------------------------------------------------------------------------------------------------------  Recent Labs  07/28/15 0524  TSH 0.683   ------------------------------------------------------------------------------------------------------------------ No results for input(s): VITAMINB12, FOLATE, FERRITIN, TIBC, IRON, RETICCTPCT in the last 72 hours.  Coagulation profile No results for input(s): INR, PROTIME in the last 168 hours.  No results for input(s): DDIMER in the last 72 hours.  Cardiac Enzymes No results for input(s): CKMB, TROPONINI, MYOGLOBIN in the last 168 hours.  Invalid input(s):  CK ------------------------------------------------------------------------------------------------------------------ Invalid input(s): POCBNP   CBG: No results for input(s): GLUCAP in the last 168 hours.     Studies: Dg Chest 2 View  07/27/2015  CLINICAL DATA:  Centralized chest pain since this morning. EXAM: CHEST  2 VIEW COMPARISON:  May 05, 2010 FINDINGS: The heart size and mediastinal contours are within normal limits. There is no focal infiltrate, pulmonary edema, or pleural effusion. The visualized skeletal structures are unremarkable. IMPRESSION: No active cardiopulmonary disease. Electronically Signed   By: Abelardo Diesel M.D.   On: 07/27/2015 18:55   Dg Abd 2 Views  07/28/2015  CLINICAL DATA:  Nausea and vomiting for 1 week EXAM: ABDOMEN - 2 VIEW COMPARISON:  November 22, 2011 FINDINGS: Supine and upright images were obtained. There is moderate stool in the colon. There is no bowel dilatation or air-fluid level suggesting obstruction. No free air. Lung bases are clear. There are phleboliths the pelvis. There is focal calcification in the abdominal aorta. IMPRESSION: Bowel gas pattern unremarkable. Moderate stool in colon. Lung bases clear. Electronically Signed   By: Lowella Grip III M.D.   On: 07/28/2015 07:22      Lab Results  Component Value Date   HGBA1C  12/01/2009    5.8 (NOTE) The ADA recommends the following therapeutic goal for glycemic control related to Hgb A1c measurement: Goal of therapy: <6.5 Hgb A1c  Reference: American Diabetes Association: Clinical Practice Recommendations 2010, Diabetes Care, 2010, 33: (Suppl  1).   HGBA1C 5.6 11/17/2008   HGBA1C  07/05/2008    5.9 (NOTE)   The ADA recommends the following therapeutic goal for glycemic   control related to Hgb A1C measurement:   Goal of Therapy:   < 7.0% Hgb A1C   Reference: American Diabetes Association: Clinical Practice   Recommendations 2008, Diabetes Care,  2008, 31:(Suppl 1).   Lab Results   Component Value Date   [facility]  12/02/2009    19        Total Cholesterol/HDL:CHD Risk Coronary Heart Disease Risk Table                     Men   Women  1/2 Average Risk   3.4   3.3  Average Risk       5.0   4.4  2 X Average Risk   9.6   7.1  3 X Average Risk  23.4   11.0        Use the calculated Patient Ratio above and the CHD Risk Table to determine the patient's CHD Risk.        ATP III CLASSIFICATION (LDL):  <100     mg/dL   Optimal  100-129  mg/dL   Near or Above                    Optimal  130-159  mg/dL   Borderline  160-189  mg/dL   High  >  190     mg/dL   Very High   CREATININE 0.86 07/28/2015       Scheduled Meds: . aspirin EC  81 mg Oral Daily  . diclofenac  50 mg Oral Q breakfast  . enoxaparin (LOVENOX) injection  40 mg Subcutaneous Daily  . feeding supplement  1 Container Oral TID BM  . losartan  100 mg Oral Daily  . metoprolol  7.5 mg Intravenous 4 times per day  . pantoprazole  40 mg Oral Daily  . [START ON 07/29/2015] pneumococcal 23 valent vaccine  0.5 mL Intramuscular Tomorrow-1000  . predniSONE  10 mg Oral Q breakfast  . simvastatin  20 mg Oral Daily   Continuous Infusions: . sodium chloride 125 mL/hr at 07/28/15 0950    Principal Problem:   Intractable vomiting with nausea Active Problems:   TOBACCO ABUSE   Essential hypertension   MYOCARDIAL INFARCTION, HX OF   GERD   Lupus ([place])   Diarrhea   Atypical chest pain   Hypercalcemia   Dehydration    Time spent: 45 minutes   [facility] Pager [phone]. If 7PM-7AM, please contact night-coverage at [email or web address], password [facility] 07/28/2015, 12:27 PM

## 2015-07-28 NOTE — Progress Notes (Signed)
Pt arrived to unit alert and oriented x4. Oriented to room, unit, and staff.  Bed in lowest position and call bell is within reach. Will continue to monitor. 

## 2015-07-29 DIAGNOSIS — E860 Dehydration: Secondary | ICD-10-CM | POA: Diagnosis not present

## 2015-07-29 DIAGNOSIS — R0789 Other chest pain: Secondary | ICD-10-CM | POA: Diagnosis not present

## 2015-07-29 DIAGNOSIS — G43A1 Cyclical vomiting, intractable: Secondary | ICD-10-CM | POA: Diagnosis not present

## 2015-07-29 LAB — CBC
HCT: 31.8 % — ABNORMAL LOW (ref 36.0–46.0)
Hemoglobin: 10.8 g/dL — ABNORMAL LOW (ref 12.0–15.0)
MCH: 31 pg (ref 26.0–34.0)
MCHC: 34 g/dL (ref 30.0–36.0)
MCV: 91.4 fL (ref 78.0–100.0)
PLATELETS: 220 10*3/uL (ref 150–400)
RBC: 3.48 MIL/uL — ABNORMAL LOW (ref 3.87–5.11)
RDW: 14 % (ref 11.5–15.5)
WBC: 5.2 10*3/uL (ref 4.0–10.5)

## 2015-07-29 LAB — COMPREHENSIVE METABOLIC PANEL
ALT: 24 U/L (ref 14–54)
ANION GAP: 8 (ref 5–15)
AST: 33 U/L (ref 15–41)
Albumin: 3.5 g/dL (ref 3.5–5.0)
Alkaline Phosphatase: 74 U/L (ref 38–126)
BUN: 13 mg/dL (ref 6–20)
CHLORIDE: 110 mmol/L (ref 101–111)
CO2: 22 mmol/L (ref 22–32)
Calcium: 8.5 mg/dL — ABNORMAL LOW (ref 8.9–10.3)
Creatinine, Ser: 0.8 mg/dL (ref 0.44–1.00)
GFR calc Af Amer: >60 mL/min (ref >60)
Glucose, Bld: 95 mg/dL (ref 65–99)
POTASSIUM: 3.4 mmol/L — AB (ref 3.5–5.1)
Sodium: 140 mmol/L (ref 135–145)
Total Bilirubin: 0.5 mg/dL (ref 0.3–1.2)
Total Protein: 7.2 g/dL (ref 6.5–8.1)

## 2015-07-29 MED ORDER — PANTOPRAZOLE SODIUM 40 MG PO TBEC: 40.0000 mg | DELAYED_RELEASE_TABLET | Freq: Every day | ORAL | Status: DC

## 2015-07-29 MED ORDER — CIPROFLOXACIN HCL 500 MG PO TABS: 500.0000 mg | ORAL_TABLET | Freq: Two times a day (BID) | ORAL | Status: DC

## 2015-07-29 MED ORDER — METRONIDAZOLE 500 MG PO TABS: 500.0000 mg | ORAL_TABLET | Freq: Three times a day (TID) | ORAL | Status: DC

## 2015-07-29 NOTE — Discharge Summary (Addendum)
Physician Discharge Summary  [name] MRN: [id number] DOB/AGE: 1961-08-17 54 y.o.  PCP: Barbette Merino, MD   Admit date: 07/27/2015 Discharge date: 07/29/2015  Discharge Diagnoses:     Principal Problem:   Intractable vomiting with nausea Active Problems:   TOBACCO ABUSE   Essential hypertension   MYOCARDIAL INFARCTION, HX OF   GERD   Lupus (HCC)   Diarrhea   Atypical chest pain   Hypercalcemia   Dehydration    Follow-up recommendations Follow-up with PCP in 3-5 days , including all  additional recommended appointments as below Follow-up CBC, CMP in 3-5 days PCP to follow-up on the results of the GI pathogen panel Patient would benefit from a GI referral and a colonoscopy within one month     Medication List    STOP taking these medications        diclofenac 50 MG EC tablet  Commonly known as:  VOLTAREN      TAKE these medications        ALPRAZolam 0.25 MG tablet  Commonly known as:  XANAX  Take 0.25 mg by mouth daily as needed. For when nervous     aspirin EC 81 MG tablet  Take 1 tablet (81 mg total) by mouth daily.     ciprofloxacin 500 MG tablet  Commonly known as:  CIPRO  Take 1 tablet (500 mg total) by mouth 2 (two) times daily.     HYDROcodone-acetaminophen 7.5-325 MG tablet  Commonly known as:  NORCO  Take 1 tablet by mouth 3 (three) times daily.     hydrOXYzine 25 MG tablet  Commonly known as:  ATARAX/VISTARIL  Take 1 tablet (25 mg total) by mouth every 6 (six) hours.     losartan 100 MG tablet  Commonly known as:  COZAAR  Take 100 mg by mouth daily.     metoprolol 100 MG tablet  Commonly known as:  LOPRESSOR  Take 100 mg by mouth 2 (two) times daily. Patient states she is taking a 51m along with the 1077mtablet per patient     metroNIDAZOLE 500 MG tablet  Commonly known as:  FLAGYL  Take 1 tablet (500 mg total) by mouth 3 (three) times daily.     NITROSTAT 0.4 MG SL tablet  Generic drug:  nitroGLYCERIN     pantoprazole 40 MG  tablet  Commonly known as:  PROTONIX  Take 1 tablet (40 mg total) by mouth daily.     simvastatin 20 MG tablet  Commonly known as:  ZOCOR  Take 20 mg by mouth daily.         Discharge Condition: Stable Discharge Instructions       Discharge Instructions    Diet - low sodium heart healthy    Complete by:  As directed      Increase activity slowly    Complete by:  As directed            No Known Allergies    Disposition: 01-Home or Self Care   Consults: None   Significant Diagnostic Studies:  Dg Chest 2 View  07/27/2015  CLINICAL DATA:  Centralized chest pain since this morning. EXAM: CHEST  2 VIEW COMPARISON:  May 05, 2010 FINDINGS: The heart size and mediastinal contours are within normal limits. There is no focal infiltrate, pulmonary edema, or pleural effusion. The visualized skeletal structures are unremarkable. IMPRESSION: No active cardiopulmonary disease. Electronically Signed   By: WeAbelardo Diesel.D.   On: 07/27/2015 18:55   Ct Abdomen Pelvis  W Contrast  07/28/2015  CLINICAL DATA:  Nausea/vomiting EXAM: CT ABDOMEN AND PELVIS WITH CONTRAST TECHNIQUE: Multidetector CT imaging of the abdomen and pelvis was performed using the standard protocol following bolus administration of intravenous contrast. CONTRAST:  167m OMNIPAQUE IOHEXOL 300 MG/ML  SOLN COMPARISON:  03/19/2011 FINDINGS: Lower chest: 3 mm subpleural nodule in the left lower lobe (series 203/image 3), unchanged since 2012, benign. Hepatobiliary: Liver is within normal limits. No suspicious/enhancing hepatic lesions. Layering tiny gallstones (series 201/ image 31). No associated inflammatory changes. Pancreas: Within normal limits. Spleen: Within normal limits. Adrenals/Urinary Tract: Adrenal glands are within normal limits. Kidneys are within normal limits.  No hydronephrosis. Bladder is mildly thick-walled although underdistended. Stomach/Bowel: Stomach is within normal limits. No evidence of bowel  obstruction. Normal appendix. Colonic wall thickening, predominantly involving the sigmoid colon (series 201/ image 64) and possibly the ascending colon (series 201/ image 43), suggesting infectious/ inflammatory colitis. No drainable fluid collection/ abscess.  No free air. Vascular/Lymphatic: Atherosclerotic calcifications of the abdominal aorta and branch vessels. No suspicious abdominopelvic lymphadenopathy. Reproductive: Uterus is mildly heterogeneous but grossly unremarkable. Bilateral ovaries are within normal limits. Other: No abdominopelvic ascites. Musculoskeletal: Mild degenerative changes of the visualized thoracolumbar spine. IMPRESSION: Sigmoid colonic wall thickening, with additional possible wall thickening involving the right colon, suggesting infectious/ inflammatory colitis. No evidence of bowel obstruction.  Normal appendix. Electronically Signed   By: SJulian HyM.D.   On: 07/28/2015 12:38   Dg Abd 2 Views  07/28/2015  CLINICAL DATA:  Nausea and vomiting for 1 week EXAM: ABDOMEN - 2 VIEW COMPARISON:  November 22, 2011 FINDINGS: Supine and upright images were obtained. There is moderate stool in the colon. There is no bowel dilatation or air-fluid level suggesting obstruction. No free air. Lung bases are clear. There are phleboliths the pelvis. There is focal calcification in the abdominal aorta. IMPRESSION: Bowel gas pattern unremarkable. Moderate stool in colon. Lung bases clear. Electronically Signed   By: WLowella GripIII M.D.   On: 07/28/2015 07:22        Filed Weights   07/28/15 0038  Weight: 57.2 kg (126 lb 1.7 oz)     Microbiology: No results found for this or any previous visit (from the past 240 hour(s)).     Blood Culture    Component Value Date/Time   SDES BLOOD LEFT ARM 08/09/2010 1456   SPECREQUEST BOTTLES DRAWN AEROBIC ONLY 10CC 08/09/2010 1456   CULT NO GROWTH 5 DAYS 08/09/2010 1456   REPTSTATUS 08/15/2010 FINAL 08/09/2010 1456       Labs: Results for orders placed or performed during the hospital encounter of 07/27/15 (from the past 48 hour(s))  Basic metabolic panel     Status: Abnormal   Collection Time: 07/27/15  6:38 PM  Result Value Ref Range   Sodium 145 135 - 145 mmol/L   Potassium 4.0 3.5 - 5.1 mmol/L   Chloride 110 101 - 111 mmol/L   CO2 19 (L) 22 - 32 mmol/L   Glucose, Bld 154 (H) 65 - 99 mg/dL   BUN 21 (H) 6 - 20 mg/dL   Creatinine, Ser 0.94 0.44 - 1.00 mg/dL   Calcium 10.5 (H) 8.9 - 10.3 mg/dL   GFR calc non Af Amer >60 >60 mL/min   GFR calc Af Amer >60 >60 mL/min    Comment: (NOTE) The eGFR has been calculated using the CKD EPI equation. This calculation has not been validated in all clinical situations. eGFR's persistently <60 mL/min signify possible  Chronic Kidney Disease.    Anion gap 16 (H) 5 - 15  CBC     Status: Abnormal   Collection Time: 07/27/15  6:38 PM  Result Value Ref Range   WBC 12.2 (H) 4.0 - 10.5 K/uL   RBC 4.57 3.87 - 5.11 MIL/uL   Hemoglobin 14.6 12.0 - 15.0 g/dL   HCT 41.2 36.0 - 46.0 %   MCV 90.2 78.0 - 100.0 fL   MCH 31.9 26.0 - 34.0 pg   MCHC 35.4 30.0 - 36.0 g/dL   RDW 13.8 11.5 - 15.5 %   Platelets 281 150 - 400 K/uL  I-stat troponin, ED (not at [facility])     Status: None   Collection Time: 07/27/15  6:38 PM  Result Value Ref Range   Troponin i, poc 0.00 0.00 - 0.08 ng/mL   Comment 3            Comment: Due to the release kinetics of cTnI, a negative result within the first hours of the onset of symptoms does not rule out myocardial infarction with certainty. If myocardial infarction is still suspected, repeat the test at appropriate intervals.   Differential     Status: Abnormal   Collection Time: 07/27/15  6:38 PM  Result Value Ref Range   Neutrophils Relative % 85 %   Neutro Abs 9.7 (H) 1.7 - 7.7 K/uL   Lymphocytes Relative 11 %   Lymphs Abs 1.2 0.7 - 4.0 K/uL   Monocytes Relative 4 %   Monocytes Absolute 0.5 0.1 - 1.0 K/uL   Eosinophils  Relative 0 %   Eosinophils Absolute 0.0 0.0 - 0.7 K/uL   Basophils Relative 0 %   Basophils Absolute 0.0 0.0 - 0.1 K/uL  I-stat troponin, ED     Status: None   Collection Time: 07/27/15  9:53 PM  Result Value Ref Range   Troponin i, poc 0.00 0.00 - 0.08 ng/mL   Comment 3            Comment: Due to the release kinetics of cTnI, a negative result within the first hours of the onset of symptoms does not rule out myocardial infarction with certainty. If myocardial infarction is still suspected, repeat the test at appropriate intervals.   I-Stat CG4 Lactic Acid, ED     Status: Abnormal   Collection Time: 07/27/15  9:56 PM  Result Value Ref Range   Lactic Acid, Venous 2.24 (HH) 0.5 - 2.0 mmol/L   Comment NOTIFIED PHYSICIAN   Urinalysis, Routine w reflex microscopic (not at [facility])     Status: Abnormal   Collection Time: 07/28/15  1:03 AM  Result Value Ref Range   Color, Urine YELLOW YELLOW   APPearance CLOUDY (A) CLEAR   Specific Gravity, Urine 1.017 1.005 - 1.030   pH 5.5 5.0 - 8.0   Glucose, UA NEGATIVE NEGATIVE mg/dL   Hgb urine dipstick TRACE (A) NEGATIVE   Bilirubin Urine NEGATIVE NEGATIVE   Ketones, ur 15 (A) NEGATIVE mg/dL   Protein, ur 30 (A) NEGATIVE mg/dL   Urobilinogen, UA 0.2 0.0 - 1.0 mg/dL   Nitrite NEGATIVE NEGATIVE   Leukocytes, UA NEGATIVE NEGATIVE  Urine microscopic-add on     Status: Abnormal   Collection Time: 07/28/15  1:03 AM  Result Value Ref Range   Squamous Epithelial / LPF RARE RARE   RBC / HPF 0-2 <3 RBC/hpf   Bacteria, UA MANY (A) RARE  Urine rapid drug screen (hosp performed)  Status: Abnormal   Collection Time: 07/28/15  1:03 AM  Result Value Ref Range   Opiates NONE DETECTED NONE DETECTED   Cocaine NONE DETECTED NONE DETECTED   Benzodiazepines NONE DETECTED NONE DETECTED   Amphetamines NONE DETECTED NONE DETECTED   Tetrahydrocannabinol POSITIVE (A) NONE DETECTED   Barbiturates NONE DETECTED NONE DETECTED    Comment:        DRUG SCREEN FOR  MEDICAL PURPOSES ONLY.  IF CONFIRMATION IS NEEDED FOR ANY PURPOSE, NOTIFY LAB WITHIN 5 DAYS.        LOWEST DETECTABLE LIMITS FOR URINE DRUG SCREEN Drug Class       Cutoff (ng/mL) Amphetamine      1000 Barbiturate      200 Benzodiazepine   967 Tricyclics       591 Opiates          300 Cocaine          300 THC              50   TSH     Status: None   Collection Time: 07/28/15  5:24 AM  Result Value Ref Range   TSH 0.683 0.350 - 4.500 uIU/mL  Comprehensive metabolic panel     Status: Abnormal   Collection Time: 07/28/15  5:24 AM  Result Value Ref Range   Sodium 143 135 - 145 mmol/L   Potassium 3.8 3.5 - 5.1 mmol/L   Chloride 110 101 - 111 mmol/L   CO2 21 (L) 22 - 32 mmol/L   Glucose, Bld 137 (H) 65 - 99 mg/dL   BUN 18 6 - 20 mg/dL   Creatinine, Ser 0.86 0.44 - 1.00 mg/dL   Calcium 9.1 8.9 - 10.3 mg/dL   Total Protein 9.2 (H) 6.5 - 8.1 g/dL   Albumin 4.4 3.5 - 5.0 g/dL   AST 30 15 - 41 U/L   ALT 28 14 - 54 U/L   Alkaline Phosphatase 88 38 - 126 U/L   Total Bilirubin 0.6 0.3 - 1.2 mg/dL   GFR calc non Af Amer >60 >60 mL/min   GFR calc Af Amer >60 >60 mL/min    Comment: (NOTE) The eGFR has been calculated using the CKD EPI equation. This calculation has not been validated in all clinical situations. eGFR's persistently <60 mL/min signify possible Chronic Kidney Disease.    Anion gap 12 5 - 15  CBC     Status: Abnormal   Collection Time: 07/28/15  5:24 AM  Result Value Ref Range   WBC 7.1 4.0 - 10.5 K/uL   RBC 3.82 (L) 3.87 - 5.11 MIL/uL   Hemoglobin 11.8 (L) 12.0 - 15.0 g/dL   HCT 34.8 (L) 36.0 - 46.0 %   MCV 91.1 78.0 - 100.0 fL   MCH 30.9 26.0 - 34.0 pg   MCHC 33.9 30.0 - 36.0 g/dL   RDW 13.9 11.5 - 15.5 %   Platelets 276 150 - 400 K/uL  C-reactive protein     Status: Abnormal   Collection Time: 07/28/15  5:24 AM  Result Value Ref Range   CRP 1.3 (H) <1.0 mg/dL  Sedimentation rate     Status: Abnormal   Collection Time: 07/28/15  5:24 AM  Result Value Ref  Range   Sed Rate 68 (H) 0 - 22 mm/hr  Magnesium     Status: None   Collection Time: 07/28/15  3:16 PM  Result Value Ref Range   Magnesium 1.9 1.7 - 2.4 mg/dL  CBC  Status: Abnormal   Collection Time: 07/29/15  6:03 AM  Result Value Ref Range   WBC 5.2 4.0 - 10.5 K/uL   RBC 3.48 (L) 3.87 - 5.11 MIL/uL   Hemoglobin 10.8 (L) 12.0 - 15.0 g/dL   HCT 31.8 (L) 36.0 - 46.0 %   MCV 91.4 78.0 - 100.0 fL   MCH 31.0 26.0 - 34.0 pg   MCHC 34.0 30.0 - 36.0 g/dL   RDW 14.0 11.5 - 15.5 %   Platelets 220 150 - 400 K/uL  Comprehensive metabolic panel     Status: Abnormal   Collection Time: 07/29/15  6:03 AM  Result Value Ref Range   Sodium 140 135 - 145 mmol/L   Potassium 3.4 (L) 3.5 - 5.1 mmol/L   Chloride 110 101 - 111 mmol/L   CO2 22 22 - 32 mmol/L   Glucose, Bld 95 65 - 99 mg/dL   BUN 13 6 - 20 mg/dL   Creatinine, Ser 0.80 0.44 - 1.00 mg/dL   Calcium 8.5 (L) 8.9 - 10.3 mg/dL   Total Protein 7.2 6.5 - 8.1 g/dL   Albumin 3.5 3.5 - 5.0 g/dL   AST 33 15 - 41 U/L   ALT 24 14 - 54 U/L   Alkaline Phosphatase 74 38 - 126 U/L   Total Bilirubin 0.5 0.3 - 1.2 mg/dL   GFR calc non Af Amer >60 >60 mL/min   GFR calc Af Amer >60 >60 mL/min    Comment: (NOTE) The eGFR has been calculated using the CKD EPI equation. This calculation has not been validated in all clinical situations. eGFR's persistently <60 mL/min signify possible Chronic Kidney Disease.    Anion gap 8 5 - 15     Lipid Panel     Component Value Date/Time   CHOL  12/02/2009 0631    76        ATP III CLASSIFICATION:  <200     mg/dL   Desirable  200-239  mg/dL   Borderline High  >=240    mg/dL   High          TRIG 61 12/02/2009 0631   HDL 45 12/02/2009 0631   CHOLHDL 1.7 12/02/2009 0631   VLDL 12 12/02/2009 0631   LDLCALC  12/02/2009 0631    19        Total Cholesterol/HDL:CHD Risk Coronary Heart Disease Risk Table                     Men   Women  1/2 Average Risk   3.4   3.3  Average Risk       5.0   4.4  2 X  Average Risk   9.6   7.1  3 X Average Risk  23.4   11.0        Use the calculated Patient Ratio above and the CHD Risk Table to determine the patient's CHD Risk.        ATP III CLASSIFICATION (LDL):  <100     mg/dL   Optimal  100-129  mg/dL   Near or Above                    Optimal  130-159  mg/dL   Borderline  160-189  mg/dL   High  >190     mg/dL   Very High     Lab Results  Component Value Date   HGBA1C  12/01/2009    5.8 (NOTE) The ADA  recommends the following therapeutic goal for glycemic control related to Hgb A1c measurement: Goal of therapy: <6.5 Hgb A1c  Reference: American Diabetes Association: Clinical Practice Recommendations 2010, Diabetes Care, 2010, 33: (Suppl  1).   HGBA1C 5.6 11/17/2008   HGBA1C  07/05/2008    5.9 (NOTE)   The ADA recommends the following therapeutic goal for glycemic   control related to Hgb A1C measurement:   Goal of Therapy:   < 7.0% Hgb A1C   Reference: American Diabetes Association: Clinical Practice   Recommendations 2008, Diabetes Care,  2008, 31:(Suppl 1).     Lab Results  Component Value Date   [facility]  12/02/2009    19        Total Cholesterol/HDL:CHD Risk Coronary Heart Disease Risk Table                     Men   Women  1/2 Average Risk   3.4   3.3  Average Risk       5.0   4.4  2 X Average Risk   9.6   7.1  3 X Average Risk  23.4   11.0        Use the calculated Patient Ratio above and the CHD Risk Table to determine the patient's CHD Risk.        ATP III CLASSIFICATION (LDL):  <100     mg/dL   Optimal  100-129  mg/dL   Near or Above                    Optimal  130-159  mg/dL   Borderline  160-189  mg/dL   High  >190     mg/dL   Very High   CREATININE 0.80 07/29/2015     HPI :*54 year old female with a past medical history significant for lupus, CAD s/p ent placement, arthritis, hyperlipidemia, hypertension, history of MI; who presents for intractable nausea vomiting and diarrhea 1 month. Patient notes that  symptoms started this morning and has not been able to keep anything down at home. She notes that she did not have anything to try to stop symptoms at home.She may have vomited at least 30 times since the beginning of today. Denies any recent antibiotics, travel, sick contacts. She's had associated symptoms of fever, chills, sweats, and chest pain. Chest pain is in the middle of her chest and intermittent. [name] also discusses issues with a rash and diarrhea. The rash has been going on for some time now and is mostly on her face and back at this time. UDS was found to be positive for marijuana   HOSPITAL COURSE:   Intractable vomiting with nausea: Likely secondary to marijuana use, vs infectious colitis CT scan of the abdomen and pelvis showed sigmoid colitis , started on ciprofloxacin/Flagyl due to suspected infectious diarrhea  concern for lupus flare in the setting of elevated lactate and CRP Previous UDS has been positive for marijuana,. On differential also include differential includes gastroenteritis Patient continued to have loose stools 3-4 times a day Treated with iV/po Zofran and Phenergan  Treated with IV fluids normal saline Tolerated  soft diet prior to discharge GI pathogen panel still pending Patient insistent on going home today despite having 2 loose stools this morning Recommended to stay another day but the patient declined Patient states that she will see her primary care provider on Monday if she continues to have problems Patient discharged on  ciprofloxacin/Flagyl 7 days She  would need a colonoscopy in 1 month to evaluate for probable ischemic colitis/GI symptoms secondary to lupus    History of Lupus ([place]): Doubt acute flare ,    Leukocytosis: Acute patient denies any recent history of steroid use. The acute stress response versus infection White count normal this morning  Essential hypertension-uncontrolled -Continue Cozaar and metoprolol Atypical chest  pain: EKG showing normal sinus rhythm with right atrial enlargement. Negative troponins 2   CAD s/p PCI /MYOCARDIAL INFARCTION, HX OF: Stable -Continue aspirin and simvastatin Cardiac enzymes negative  Diarrhea: Chronic as patient states symptoms have been going on for over a month now. Will benefit from foot from colonoscopy in outpatient GI consultation Follow GI pathogen panel   Anxiety: -prn Xanax  Rash: Patient noted some exposure rash on face and back. Suspect secondary to patient's history of lupus -Hydroxyzine for itching  Tobacco abuse: Patient has smoked for many years.  -Counseling patient about the importance of quitting smoking.  -Nicotine patch offered  Arthritis: stable. Associated most likely with patient's history of lupus -Continue diclofenac  GERD -protonix  Hypercalcemia: Calcium 10.5 , now 9.1, improved with IV hydration    Discharge Exam:   Blood pressure 119/79, pulse 67, temperature 97.9 F (36.6 C), temperature source Oral, resp. rate 18, height '5\' 6"'  (1.676 m), weight 57.2 kg (126 lb 1.7 oz), SpO2 100 %.  General: No acute respiratory distress Lungs: Clear to auscultation bilaterally without wheezes or crackles Cardiovascular: Regular rate and rhythm without murmur gallop or rub normal S1 and S2 Abdomen: Nontender, nondistended, soft, bowel sounds positive, no rebound, no ascites, no appreciable mass Extremities: No significant cyanosis, clubbing, or edema bilateral lower extremities     Follow-up Information    Follow up with Barbette Merino, MD. Schedule an appointment as soon as possible for a visit on 07/31/2015.   Specialty:  Internal Medicine   Contact information:   [address] [place] [id number] [phone]       Signed: Reyne Dumas 07/29/2015, 11:16 AM        Time spent >45 mins

## 2015-07-29 NOTE — Progress Notes (Signed)
Patient discharge teaching given, including activity, diet, follow-up appointments, and medications. Patient verbalized understanding of all discharge instructions. IV access was d/c'd. Vitals are stable. Patient leaving with son.   Alex Gardener, RN

## 2015-08-01 LAB — GI PATHOGEN PANEL BY PCR, STOOL
C difficile toxin A/B: NOT DETECTED
CAMPYLOBACTER BY PCR: NOT DETECTED
Cryptosporidium by PCR: NOT DETECTED
E COLI (ETEC) LT/ST: NOT DETECTED
E coli (STEC): NOT DETECTED
E coli 0157 by PCR: NOT DETECTED
G LAMBLIA BY PCR: NOT DETECTED
NOROVIRUS G1/G2: NOT DETECTED
Rotavirus A by PCR: NOT DETECTED
SALMONELLA BY PCR: NOT DETECTED
SHIGELLA BY PCR: NOT DETECTED

## 2015-08-18 ENCOUNTER — Encounter: Payer: Self-pay | Admitting: Gastroenterology

## 2015-08-25 ENCOUNTER — Ambulatory Visit (INDEPENDENT_AMBULATORY_CARE_PROVIDER_SITE_OTHER): Payer: Medicare Other | Admitting: Physician Assistant

## 2015-08-25 ENCOUNTER — Other Ambulatory Visit (INDEPENDENT_AMBULATORY_CARE_PROVIDER_SITE_OTHER): Payer: Medicare Other

## 2015-08-25 ENCOUNTER — Encounter: Payer: Self-pay | Admitting: Physician Assistant

## 2015-08-25 VITALS — BP 106/74 | HR 84 | Ht 65.0 in | Wt 127.0 lb

## 2015-08-25 DIAGNOSIS — Z8619 Personal history of other infectious and parasitic diseases: Secondary | ICD-10-CM

## 2015-08-25 DIAGNOSIS — K5730 Diverticulosis of large intestine without perforation or abscess without bleeding: Secondary | ICD-10-CM

## 2015-08-25 LAB — CBC WITH DIFFERENTIAL/PLATELET
BASOS ABS: 0 10*3/uL (ref 0.0–0.1)
BASOS PCT: 0.2 % (ref 0.0–3.0)
EOS PCT: 4.2 % (ref 0.0–5.0)
Eosinophils Absolute: 0.2 10*3/uL (ref 0.0–0.7)
HEMATOCRIT: 36.3 % (ref 36.0–46.0)
Hemoglobin: 11.9 g/dL — ABNORMAL LOW (ref 12.0–15.0)
LYMPHS PCT: 29.4 % (ref 12.0–46.0)
Lymphs Abs: 1.6 10*3/uL (ref 0.7–4.0)
MCHC: 32.8 g/dL (ref 30.0–36.0)
MCV: 94.3 fl (ref 78.0–100.0)
MONOS PCT: 10.8 % (ref 3.0–12.0)
Monocytes Absolute: 0.6 10*3/uL (ref 0.1–1.0)
NEUTROS ABS: 3 10*3/uL (ref 1.4–7.7)
Neutrophils Relative %: 55.4 % (ref 43.0–77.0)
PLATELETS: 291 10*3/uL (ref 150.0–400.0)
RBC: 3.85 Mil/uL — ABNORMAL LOW (ref 3.87–5.11)
RDW: 13.8 % (ref 11.5–15.5)
WBC: 5.4 10*3/uL (ref 4.0–10.5)

## 2015-08-25 MED ORDER — SACCHAROMYCES BOULARDII 250 MG PO CAPS: 250.0000 mg | ORAL_CAPSULE | Freq: Two times a day (BID) | ORAL | Status: DC

## 2015-08-25 NOTE — Patient Instructions (Addendum)
Your physician has requested that you go to the basement for the following lab work before leaving today: CBC and H.pylori stool study  We have sent the following medications to your pharmacy for you to pick up at your convenience: Florastor 250mg  1 twice a day.  Please start Benefiber. One heaping TBSP in water daily.  Please call the office to schedule a follow up with Cecille Rubin Hvozdvic PA in 2 months. [phone]

## 2015-08-26 ENCOUNTER — Encounter: Payer: Self-pay | Admitting: Physician Assistant

## 2015-08-26 NOTE — Progress Notes (Signed)
Patient ID: [name], female   DOB: Apr 18, 1961, 54 y.o.   MRN: [id number]     History of Present Illness: [name] is a pleasant 54 year old female who was recently admitted to the hospital 07/27/2015 through 07/29/2015. She has a past medical history significant for lupus, CAD status post stent placement, arthritis, hyperlipidemia, hypertension, and a history of MI. She also has a history of diverticulosis. She was admitted with complaints of a one-month history of diarrhea and intractable nausea and vomiting. CT of the abdomen and pelvis showed sigmoid colitis. She was started on Cipro and Flagyl due to suspected infectious diarrhea or diverticulitis. She reports that she had been having left lower quadrant and suprapubic pain. Nausea was felt to be due to either her colitis or marijuana use. She was discharged after 2 days of IV enema by Eulis Foster on oral Cipro and Flagyl for 7 days. She was advised to follow-up with GI and presents to do so.  She has previously been seen by Dr. Sharlett Iles. She had a colonoscopy in August 2012 and was noted to have moderate diverticulosis in the descending and sigmoid colon. The colon was otherwise normal with no polyps. She also had an EGD on 04/27/2003 which was noted to be normal. H. pylori was positive and she was treated. She has finished her Cipro and Flagyl and she states her bowel movements are soft formed once or twice a day. She is gassy but overall feels she is much better. She has no nausea or vomiting. She occasionally has some mild epigastric discomfort on an empty stomach. As stated before. She has a history of H. pylori positivity and she has never been tested to see if she cleared the H. pylori. Overall, she feels she is doing much better. She does occasionally get some mild left lower quadrant cramping prior to defecation, relieved with defecation. She has had no fever, chills, or night sweats. Her appetite has been good and her weight has been  stable. She has had no bright red blood per rectum or melena.   Past Medical History  Diagnosis Date  . Hypertension   . Coronary artery disease   . Lupus ([place])   . Myocardial infarction ([place]) 2009  . Hyperlipidemia   . Anxiety   . Arthritis     rheumatoid  . Gallstones     CT 07/28/15  . Diverticulosis   . H. pylori infection     Past Surgical History  Procedure Laterality Date  . Mastectomy     Family History  Problem Relation Age of Onset  . Stomach cancer    . Breast cancer    . Heart disease    . Stomach cancer Mother   . Breast cancer Sister   . Brain cancer Brother    Social History  Substance Use Topics  . Smoking status: Current Every Day Smoker -- 0.25 packs/day  . Smokeless tobacco: Never Used     Comment: about 1/2 pack weekly  . Alcohol Use: 0.0 oz/week    0 Standard drinks or equivalent per week   Current Outpatient Prescriptions  Medication Sig Dispense Refill  . ALPRAZolam (XANAX) 0.25 MG tablet Take 0.25 mg by mouth daily as needed. For when nervous     . aspirin EC 81 MG tablet Take 1 tablet (81 mg total) by mouth daily. 30 tablet 0  . ciprofloxacin (CIPRO) 500 MG tablet Take 1 tablet (500 mg total) by mouth 2 (two) times daily. 14 tablet 0  .  HYDROcodone-acetaminophen (NORCO) 7.5-325 MG tablet Take 1 tablet by mouth 3 (three) times daily.  0  . hydrOXYzine (ATARAX/VISTARIL) 25 MG tablet Take 1 tablet (25 mg total) by mouth every 6 (six) hours. 12 tablet 0  . losartan (COZAAR) 100 MG tablet Take 100 mg by mouth daily.      . metoprolol (LOPRESSOR) 100 MG tablet Take 100 mg by mouth 2 (two) times daily. Patient states she is taking a 25mg  along with the 100mg  tablet per patient    . pantoprazole (PROTONIX) 40 MG tablet Take 1 tablet (40 mg total) by mouth daily. 30 tablet 0  . simvastatin (ZOCOR) 20 MG tablet Take 20 mg by mouth daily.    Marland Kitchen saccharomyces boulardii (FLORASTOR) 250 MG capsule Take 1 capsule (250 mg total) by mouth 2 (two) times daily.  30 capsule 0   No current facility-administered medications for this visit.   No Known Allergies   Review of Systems: Per history of present illness otherwise negative.  LAB RESULTS:  Recent Labs  08/25/15 0934  WBC 5.4  HGB 11.9*  HCT 36.3  PLT 291.0    Studies:   Ct Abdomen Pelvis W Contrast  07/28/2015  CLINICAL DATA:  Nausea/vomiting EXAM: CT ABDOMEN AND PELVIS WITH CONTRAST TECHNIQUE: Multidetector CT imaging of the abdomen and pelvis was performed using the standard protocol following bolus administration of intravenous contrast. CONTRAST:  144mL OMNIPAQUE IOHEXOL 300 MG/ML  SOLN COMPARISON:  03/19/2011 FINDINGS: Lower chest: 3 mm subpleural nodule in the left lower lobe (series 203/image 3), unchanged since 2012, benign. Hepatobiliary: Liver is within normal limits. No suspicious/enhancing hepatic lesions. Layering tiny gallstones (series 201/ image 31). No associated inflammatory changes. Pancreas: Within normal limits. Spleen: Within normal limits. Adrenals/Urinary Tract: Adrenal glands are within normal limits. Kidneys are within normal limits.  No hydronephrosis. Bladder is mildly thick-walled although underdistended. Stomach/Bowel: Stomach is within normal limits. No evidence of bowel obstruction. Normal appendix. Colonic wall thickening, predominantly involving the sigmoid colon (series 201/ image 64) and possibly the ascending colon (series 201/ image 43), suggesting infectious/ inflammatory colitis. No drainable fluid collection/ abscess.  No free air. Vascular/Lymphatic: Atherosclerotic calcifications of the abdominal aorta and branch vessels. No suspicious abdominopelvic lymphadenopathy. Reproductive: Uterus is mildly heterogeneous but grossly unremarkable. Bilateral ovaries are within normal limits. Other: No abdominopelvic ascites. Musculoskeletal: Mild degenerative changes of the visualized thoracolumbar spine. IMPRESSION: Sigmoid colonic wall thickening, with additional  possible wall thickening involving the right colon, suggesting infectious/ inflammatory colitis. No evidence of bowel obstruction.  Normal appendix. Electronically Signed   By: Julian Hy M.D.   On: 07/28/2015 12:38   Dg Abd 2 Views  07/28/2015  CLINICAL DATA:  Nausea and vomiting for 1 week EXAM: ABDOMEN - 2 VIEW COMPARISON:  November 22, 2011 FINDINGS: Supine and upright images were obtained. There is moderate stool in the colon. There is no bowel dilatation or air-fluid level suggesting obstruction. No free air. Lung bases are clear. There are phleboliths the pelvis. There is focal calcification in the abdominal aorta. IMPRESSION: Bowel gas pattern unremarkable. Moderate stool in colon. Lung bases clear. Electronically Signed   By: Lowella Grip III M.D.   On: 07/28/2015 07:22     Physical Exam: BP 106/74 mmHg  Pulse 84  Ht 5\' 5"  (1.651 m)  Wt 127 lb 0.6 oz (57.624 kg)  BMI 21.14 kg/m2 General: Pleasant, well developed , Caucasian female in no acute distress Head: Normocephalic and atraumatic Eyes:  sclerae anicteric, conjunctiva pink  Ears: Normal auditory acuity Lungs: Clear throughout to auscultation Heart: Regular rate and rhythm Abdomen: Soft, non distended, non-tender. No masses, no hepatomegaly. Normal bowel sound Musculoskeletal: Symmetrical with no gross deformities  Extremities: No edema  Neurological: Alert oriented x 4, grossly nonfocal Psychological:  Alert and cooperative. Normal mood and affect  Assessment and Recommendations: 54 year old female with a history of diverticulosis status post recent admission for a several week history of diarrhea and left lower quadrant pain, here for follow-up. CT scan was suggestive of sigmoid thickening. This may be due to her ongoing diverticular disease and have been exacerbated with acute infection. She will use fluoroscopy store 250 mg 1 by mouth twice daily for one month to see if this will help with some of her gas as well  as to firm up her stools a little bit. She will also try Benefiber 1 heaping tablespoon in water each morning. She will follow-up in 2 months, sooner if needed. At that time, we will revisit whether she needs colonoscopy for further evaluation of the wall thickening, though it was likely due to the active inflammation. A CBC will be obtained today. An H. pylori stool antigen will also be obtained to see if she has cleared the H. pylori.        Jarron Curley, Vita Barley PA-C 08/26/2015,

## 2015-08-28 NOTE — Progress Notes (Signed)
Could assess CT changes w/ sigmoidoscopy if that is thought necessary on f/u

## 2015-08-31 ENCOUNTER — Other Ambulatory Visit: Payer: Medicare Other

## 2015-08-31 DIAGNOSIS — K5730 Diverticulosis of large intestine without perforation or abscess without bleeding: Secondary | ICD-10-CM

## 2015-08-31 DIAGNOSIS — Z8619 Personal history of other infectious and parasitic diseases: Secondary | ICD-10-CM

## 2015-09-01 LAB — HELICOBACTER PYLORI  SPECIAL ANTIGEN: H. PYLORI Antigen: NOT DETECTED

## 2015-09-04 ENCOUNTER — Encounter: Payer: Self-pay | Admitting: *Deleted

## 2016-05-17 DIAGNOSIS — R112 Nausea with vomiting, unspecified: Secondary | ICD-10-CM

## 2016-05-17 HISTORY — DX: Nausea with vomiting, unspecified: R11.2

## 2016-05-30 ENCOUNTER — Emergency Department (HOSPITAL_COMMUNITY): Payer: Medicare Other

## 2016-05-30 ENCOUNTER — Inpatient Hospital Stay (HOSPITAL_COMMUNITY)
Admission: EM | Admit: 2016-05-30 | Discharge: 2016-06-03 | DRG: 392 | Discharge status: Against Medical Advice | Payer: Medicare Other | Attending: Internal Medicine | Admitting: Internal Medicine

## 2016-05-30 ENCOUNTER — Encounter (HOSPITAL_COMMUNITY): Payer: Self-pay | Admitting: Emergency Medicine

## 2016-05-30 DIAGNOSIS — R1030 Lower abdominal pain, unspecified: Secondary | ICD-10-CM | POA: Diagnosis not present

## 2016-05-30 DIAGNOSIS — E785 Hyperlipidemia, unspecified: Secondary | ICD-10-CM | POA: Diagnosis present

## 2016-05-30 DIAGNOSIS — I10 Essential (primary) hypertension: Secondary | ICD-10-CM

## 2016-05-30 DIAGNOSIS — R1110 Vomiting, unspecified: Secondary | ICD-10-CM

## 2016-05-30 DIAGNOSIS — I2510 Atherosclerotic heart disease of native coronary artery without angina pectoris: Secondary | ICD-10-CM | POA: Diagnosis present

## 2016-05-30 DIAGNOSIS — Z800 Family history of malignant neoplasm of digestive organs: Secondary | ICD-10-CM

## 2016-05-30 DIAGNOSIS — R1084 Generalized abdominal pain: Secondary | ICD-10-CM | POA: Diagnosis not present

## 2016-05-30 DIAGNOSIS — K219 Gastro-esophageal reflux disease without esophagitis: Secondary | ICD-10-CM

## 2016-05-30 DIAGNOSIS — Z23 Encounter for immunization: Secondary | ICD-10-CM

## 2016-05-30 DIAGNOSIS — M0680 Other specified rheumatoid arthritis, unspecified site: Secondary | ICD-10-CM | POA: Diagnosis present

## 2016-05-30 DIAGNOSIS — M329 Systemic lupus erythematosus, unspecified: Secondary | ICD-10-CM

## 2016-05-30 DIAGNOSIS — Z79899 Other long term (current) drug therapy: Secondary | ICD-10-CM

## 2016-05-30 DIAGNOSIS — R109 Unspecified abdominal pain: Secondary | ICD-10-CM

## 2016-05-30 DIAGNOSIS — I252 Old myocardial infarction: Secondary | ICD-10-CM

## 2016-05-30 DIAGNOSIS — E876 Hypokalemia: Secondary | ICD-10-CM

## 2016-05-30 DIAGNOSIS — R112 Nausea with vomiting, unspecified: Secondary | ICD-10-CM | POA: Diagnosis present

## 2016-05-30 DIAGNOSIS — F1020 Alcohol dependence, uncomplicated: Secondary | ICD-10-CM

## 2016-05-30 DIAGNOSIS — Z7982 Long term (current) use of aspirin: Secondary | ICD-10-CM

## 2016-05-30 DIAGNOSIS — E860 Dehydration: Secondary | ICD-10-CM | POA: Diagnosis not present

## 2016-05-30 DIAGNOSIS — F419 Anxiety disorder, unspecified: Secondary | ICD-10-CM | POA: Diagnosis present

## 2016-05-30 DIAGNOSIS — N179 Acute kidney failure, unspecified: Secondary | ICD-10-CM

## 2016-05-30 DIAGNOSIS — F17200 Nicotine dependence, unspecified, uncomplicated: Secondary | ICD-10-CM | POA: Diagnosis present

## 2016-05-30 HISTORY — DX: Systemic lupus erythematosus, unspecified: M32.9

## 2016-05-30 HISTORY — DX: Low back pain: M54.5

## 2016-05-30 HISTORY — DX: Nausea with vomiting, unspecified: R11.2

## 2016-05-30 HISTORY — DX: Low back pain, unspecified: M54.50

## 2016-05-30 HISTORY — DX: Fibromyalgia: M79.7

## 2016-05-30 HISTORY — DX: Rheumatoid arthritis, unspecified: M06.9

## 2016-05-30 HISTORY — DX: Other chronic pain: G89.29

## 2016-05-30 LAB — I-STAT CG4 LACTIC ACID, ED
LACTIC ACID, VENOUS: 2.71 mmol/L — AB (ref 0.5–1.9)
Lactic Acid, Venous: 2.52 mmol/L (ref 0.5–1.9)

## 2016-05-30 LAB — ETHANOL

## 2016-05-30 LAB — COMPREHENSIVE METABOLIC PANEL
ALT: 25 U/L (ref 14–54)
AST: 28 U/L (ref 15–41)
Albumin: 4.1 g/dL (ref 3.5–5.0)
Alkaline Phosphatase: 55 U/L (ref 38–126)
Anion gap: 11 (ref 5–15)
BUN: 20 mg/dL (ref 6–20)
CHLORIDE: 114 mmol/L — AB (ref 101–111)
CO2: 21 mmol/L — ABNORMAL LOW (ref 22–32)
Calcium: 9.4 mg/dL (ref 8.9–10.3)
Creatinine, Ser: 0.77 mg/dL (ref 0.44–1.00)
GFR calc Af Amer: >60 mL/min (ref >60)
Glucose, Bld: 114 mg/dL — ABNORMAL HIGH (ref 65–99)
POTASSIUM: 2.9 mmol/L — AB (ref 3.5–5.1)
SODIUM: 146 mmol/L — AB (ref 135–145)
Total Bilirubin: 0.5 mg/dL (ref 0.3–1.2)
Total Protein: 8 g/dL (ref 6.5–8.1)

## 2016-05-30 LAB — CBC
HCT: 36.4 % (ref 36.0–46.0)
HEMOGLOBIN: 12.3 g/dL (ref 12.0–15.0)
MCH: 30.8 pg (ref 26.0–34.0)
MCHC: 33.8 g/dL (ref 30.0–36.0)
MCV: 91 fL (ref 78.0–100.0)
Platelets: 268 10*3/uL (ref 150–400)
RBC: 4 MIL/uL (ref 3.87–5.11)
RDW: 13.9 % (ref 11.5–15.5)
WBC: 7.4 10*3/uL (ref 4.0–10.5)

## 2016-05-30 LAB — URINALYSIS, ROUTINE W REFLEX MICROSCOPIC
Bilirubin Urine: NEGATIVE
Glucose, UA: NEGATIVE mg/dL
Hgb urine dipstick: NEGATIVE
Ketones, ur: NEGATIVE mg/dL
Leukocytes, UA: NEGATIVE
NITRITE: NEGATIVE
Protein, ur: NEGATIVE mg/dL
SPECIFIC GRAVITY, URINE: 1.014 (ref 1.005–1.030)
pH: 6 (ref 5.0–8.0)

## 2016-05-30 LAB — MAGNESIUM: Magnesium: 1.6 mg/dL — ABNORMAL LOW (ref 1.7–2.4)

## 2016-05-30 LAB — LIPASE, BLOOD: LIPASE: 21 U/L (ref 11–51)

## 2016-05-30 MED ORDER — ASPIRIN EC 81 MG PO TBEC
81.0000 mg | DELAYED_RELEASE_TABLET | Freq: Every day | ORAL | Status: DC
  Administered 2016-05-31 – 2016-06-03 (×4): 81 mg via ORAL
  Filled 2016-05-30 (×4): qty 1

## 2016-05-30 MED ORDER — ALPRAZOLAM 0.25 MG PO TABS
0.2500 mg | ORAL_TABLET | Freq: Three times a day (TID) | ORAL | Status: DC | PRN
  Administered 2016-06-02: 0.25 mg via ORAL
  Filled 2016-05-30: qty 1

## 2016-05-30 MED ORDER — ONDANSETRON 4 MG PO TBDP
4.0000 mg | ORAL_TABLET | Freq: Once | ORAL | Status: AC | PRN
  Administered 2016-05-30: 4 mg via ORAL

## 2016-05-30 MED ORDER — SODIUM CHLORIDE 0.9 % IV BOLUS (SEPSIS)
1000.0000 mL | Freq: Once | INTRAVENOUS | Status: AC
  Administered 2016-05-30: 1000 mL via INTRAVENOUS

## 2016-05-30 MED ORDER — HYDROCODONE-ACETAMINOPHEN 5-325 MG PO TABS
1.0000 | ORAL_TABLET | Freq: Four times a day (QID) | ORAL | Status: DC | PRN
  Administered 2016-06-01: 1 via ORAL
  Administered 2016-06-01: 2 via ORAL
  Filled 2016-05-30: qty 2
  Filled 2016-05-30 (×2): qty 1

## 2016-05-30 MED ORDER — PIPERACILLIN-TAZOBACTAM 3.375 G IVPB 30 MIN
3.3750 g | Freq: Once | INTRAVENOUS | Status: AC
  Administered 2016-05-30: 3.375 g via INTRAVENOUS
  Filled 2016-05-30: qty 50

## 2016-05-30 MED ORDER — HYDRALAZINE HCL 20 MG/ML IJ SOLN
10.0000 mg | INTRAMUSCULAR | Status: DC | PRN
  Administered 2016-05-31: 10 mg via INTRAVENOUS
  Filled 2016-05-30: qty 1

## 2016-05-30 MED ORDER — ACETAMINOPHEN 650 MG RE SUPP: 650.0000 mg | Freq: Four times a day (QID) | RECTAL | Status: DC | PRN

## 2016-05-30 MED ORDER — SODIUM CHLORIDE 0.9% FLUSH
3.0000 mL | Freq: Two times a day (BID) | INTRAVENOUS | Status: DC
  Administered 2016-05-31 – 2016-06-03 (×5): 3 mL via INTRAVENOUS

## 2016-05-30 MED ORDER — LOSARTAN POTASSIUM 50 MG PO TABS
100.0000 mg | ORAL_TABLET | Freq: Every day | ORAL | Status: DC
  Administered 2016-05-31 – 2016-06-02 (×3): 100 mg via ORAL
  Filled 2016-05-30 (×3): qty 2

## 2016-05-30 MED ORDER — ONDANSETRON HCL 4 MG PO TABS: 4.0000 mg | ORAL_TABLET | Freq: Four times a day (QID) | ORAL | Status: DC | PRN

## 2016-05-30 MED ORDER — LORAZEPAM 1 MG PO TABS: 1.0000 mg | ORAL_TABLET | Freq: Four times a day (QID) | ORAL | Status: AC | PRN

## 2016-05-30 MED ORDER — ADULT MULTIVITAMIN W/MINERALS CH
1.0000 | ORAL_TABLET | Freq: Every day | ORAL | Status: DC
  Administered 2016-05-31 – 2016-06-03 (×5): 1 via ORAL
  Filled 2016-05-30 (×5): qty 1

## 2016-05-30 MED ORDER — THIAMINE HCL 100 MG/ML IJ SOLN: 100.0000 mg | Freq: Every day | INTRAMUSCULAR | Status: DC

## 2016-05-30 MED ORDER — METHYLPREDNISOLONE SODIUM SUCC 40 MG IJ SOLR
40.0000 mg | Freq: Every day | INTRAMUSCULAR | Status: DC
  Administered 2016-05-31 – 2016-06-03 (×5): 40 mg via INTRAVENOUS
  Filled 2016-05-30 (×5): qty 1

## 2016-05-30 MED ORDER — METOPROLOL TARTRATE 25 MG PO TABS: 100.0000 mg | ORAL_TABLET | Freq: Two times a day (BID) | ORAL | Status: DC

## 2016-05-30 MED ORDER — ENOXAPARIN SODIUM 40 MG/0.4ML ~~LOC~~ SOLN
40.0000 mg | SUBCUTANEOUS | Status: DC
  Administered 2016-05-31 – 2016-06-02 (×3): 40 mg via SUBCUTANEOUS
  Filled 2016-05-30 (×3): qty 0.4

## 2016-05-30 MED ORDER — POTASSIUM CHLORIDE IN NACL 40-0.9 MEQ/L-% IV SOLN
INTRAVENOUS | Status: AC
  Administered 2016-05-31: 75 mL/h via INTRAVENOUS
  Filled 2016-05-30: qty 1000

## 2016-05-30 MED ORDER — POTASSIUM CHLORIDE 10 MEQ/100ML IV SOLN
10.0000 meq | Freq: Once | INTRAVENOUS | Status: AC
  Administered 2016-05-30: 10 meq via INTRAVENOUS
  Filled 2016-05-30: qty 100

## 2016-05-30 MED ORDER — LORAZEPAM 2 MG/ML IJ SOLN
0.0000 mg | Freq: Two times a day (BID) | INTRAMUSCULAR | Status: DC
  Administered 2016-06-03: 2 mg via INTRAVENOUS
  Filled 2016-05-30: qty 2

## 2016-05-30 MED ORDER — HYDROMORPHONE HCL 1 MG/ML IJ SOLN: 0.5000 mg | INTRAMUSCULAR | Status: DC | PRN

## 2016-05-30 MED ORDER — ONDANSETRON HCL 4 MG/2ML IJ SOLN
4.0000 mg | Freq: Once | INTRAMUSCULAR | Status: AC
Start: 2016-05-30 — End: 2016-05-30
  Administered 2016-05-30: 4 mg via INTRAVENOUS
  Filled 2016-05-30: qty 2

## 2016-05-30 MED ORDER — FENTANYL CITRATE (PF) 100 MCG/2ML IJ SOLN
100.0000 ug | Freq: Once | INTRAMUSCULAR | Status: AC
  Administered 2016-05-30: 100 ug via INTRAVENOUS
  Filled 2016-05-30: qty 2

## 2016-05-30 MED ORDER — CIPROFLOXACIN IN D5W 400 MG/200ML IV SOLN
400.0000 mg | Freq: Two times a day (BID) | INTRAVENOUS | Status: DC
  Administered 2016-05-31 – 2016-06-02 (×5): 400 mg via INTRAVENOUS
  Filled 2016-05-30 (×7): qty 200

## 2016-05-30 MED ORDER — FAMOTIDINE IN NACL 20-0.9 MG/50ML-% IV SOLN
20.0000 mg | Freq: Two times a day (BID) | INTRAVENOUS | Status: DC
  Administered 2016-05-31 (×2): 20 mg via INTRAVENOUS
  Filled 2016-05-30 (×3): qty 50

## 2016-05-30 MED ORDER — VITAMIN B-1 100 MG PO TABS
100.0000 mg | ORAL_TABLET | Freq: Every day | ORAL | Status: DC
  Administered 2016-05-31 – 2016-06-03 (×5): 100 mg via ORAL
  Filled 2016-05-30 (×5): qty 1

## 2016-05-30 MED ORDER — ONDANSETRON HCL 4 MG/2ML IJ SOLN
4.0000 mg | Freq: Four times a day (QID) | INTRAMUSCULAR | Status: DC | PRN
  Administered 2016-05-31 (×3): 4 mg via INTRAVENOUS
  Filled 2016-05-30 (×3): qty 2

## 2016-05-30 MED ORDER — PANTOPRAZOLE SODIUM 40 MG PO TBEC
40.0000 mg | DELAYED_RELEASE_TABLET | Freq: Every day | ORAL | Status: DC
  Administered 2016-05-31 – 2016-06-03 (×4): 40 mg via ORAL
  Filled 2016-05-30 (×4): qty 1

## 2016-05-30 MED ORDER — METOPROLOL TARTRATE 25 MG PO TABS
125.0000 mg | ORAL_TABLET | Freq: Two times a day (BID) | ORAL | Status: DC
  Administered 2016-05-31 – 2016-06-03 (×8): 125 mg via ORAL
  Filled 2016-05-30 (×8): qty 1

## 2016-05-30 MED ORDER — LORAZEPAM 2 MG/ML IJ SOLN
0.0000 mg | Freq: Four times a day (QID) | INTRAMUSCULAR | Status: AC
  Administered 2016-05-31 – 2016-06-01 (×3): 2 mg via INTRAVENOUS
  Filled 2016-05-30 (×4): qty 1

## 2016-05-30 MED ORDER — MAGNESIUM SULFATE 2 GM/50ML IV SOLN
2.0000 g | Freq: Once | INTRAVENOUS | Status: AC
  Administered 2016-05-30: 2 g via INTRAVENOUS
  Filled 2016-05-30: qty 50

## 2016-05-30 MED ORDER — SIMVASTATIN 20 MG PO TABS
20.0000 mg | ORAL_TABLET | Freq: Every day | ORAL | Status: DC
  Administered 2016-05-31 – 2016-06-01 (×2): 20 mg via ORAL
  Filled 2016-05-30 (×2): qty 1

## 2016-05-30 MED ORDER — FOLIC ACID 1 MG PO TABS
1.0000 mg | ORAL_TABLET | Freq: Every day | ORAL | Status: DC
  Administered 2016-05-31 – 2016-06-03 (×5): 1 mg via ORAL
  Filled 2016-05-30 (×5): qty 1

## 2016-05-30 MED ORDER — ACETAMINOPHEN 325 MG PO TABS: 650.0000 mg | ORAL_TABLET | Freq: Four times a day (QID) | ORAL | Status: DC | PRN

## 2016-05-30 MED ORDER — NICOTINE 7 MG/24HR TD PT24
7.0000 mg | MEDICATED_PATCH | Freq: Every day | TRANSDERMAL | Status: DC
  Administered 2016-05-31 – 2016-06-03 (×4): 7 mg via TRANSDERMAL
  Filled 2016-05-30 (×4): qty 1

## 2016-05-30 MED ORDER — LORAZEPAM 2 MG/ML IJ SOLN
1.0000 mg | Freq: Four times a day (QID) | INTRAMUSCULAR | Status: AC | PRN
  Filled 2016-05-30: qty 1

## 2016-05-30 MED ORDER — LORAZEPAM 2 MG/ML IJ SOLN
1.0000 mg | Freq: Once | INTRAMUSCULAR | Status: AC
  Administered 2016-05-30: 1 mg via INTRAVENOUS
  Filled 2016-05-30: qty 1

## 2016-05-30 MED ORDER — ONDANSETRON HCL 4 MG/2ML IJ SOLN
4.0000 mg | Freq: Once | INTRAMUSCULAR | Status: AC
  Administered 2016-05-30: 4 mg via INTRAVENOUS
  Filled 2016-05-30: qty 2

## 2016-05-30 MED ORDER — METRONIDAZOLE IN NACL 5-0.79 MG/ML-% IV SOLN
500.0000 mg | Freq: Three times a day (TID) | INTRAVENOUS | Status: DC
  Administered 2016-05-31 – 2016-06-03 (×11): 500 mg via INTRAVENOUS
  Filled 2016-05-30 (×11): qty 100

## 2016-05-30 MED ORDER — ONDANSETRON 4 MG PO TBDP
ORAL_TABLET | ORAL | Status: AC
  Filled 2016-05-30: qty 1

## 2016-05-30 NOTE — ED Notes (Signed)
Pt unable to urinate at this time.  

## 2016-05-30 NOTE — H&P (Signed)
History and Physical    [name] [id number] DOB: [birth year] DOA: 05/30/2016  PCP: Barbette Merino, MD   Patient coming from: Home   Chief Complaint: Abdominal pain, nausea, vomiting, diarrhea   HPI: [name] is a 55 y.o. female with medical history significant for lupus, hypertension, hyperlipidemia, anxiety, and coronary artery disease who presents to the emergency department for evaluation of abdominal pain with nausea, nonbloody vomiting, and nonbloody diarrhea. Patient reports that she had been suffering from a cutaneous flare in her lupus for the past 2 weeks and has been managed with 20 mg daily prednisone for more than a week now. She had otherwise been in her usual state of health until waking this morning with abdominal pain in the lower to mid abdomen and nausea. Patient went on to develop nonbloody, nonbilious vomiting and has had numerous episodes of this. She also reports a nonbloody diarrhea, also developed today, but she has this frequently for many years. She reports chills this morning, but denies any recent sick contacts or long distance travel. Pain was described as crampy, intermittent, better with vomiting or diarrhea, and with no exacerbating factors identified. She reports experiencing similar symptoms multiple times in the past, reports that she has been evaluated with CT of the abdomen many times with no significant findings, and refuses that today, but agrees to reconsider if she worsens or fails to improve with the current management.  ED Course: Upon arrival to the ED, patient is found to have a temperature of 37.8 C, saturating well on room air, bradycardic in the mid 50s, and hypertensive to 180/115. EKG features a sinus rhythm and chest x-ray is notable for mild chronic interstitial lung disease but negative for acute cardiopulmonary disease. CMP features widespread derangements, including potassium 2.9, chloride 114, and magnesium of 1.6. LFTs and lipase are  within the normal limits, as is CBC with differential. Ethanol level is undetectable and urinalysis is unremarkable. Lactic acid returned elevated to 2.71. 2 L of normal saline were given as a bolus in the emergency department and blood and urine cultures were collected. Patient was treated empirically with Zosyn, magnesium and potassium were supplemented intravenously, and symptomatic care was provided with fentanyl, Zofran, and Ativan. Patient enjoyed resolution of the abdominal pain, but unfortunately he has continued to experience nausea and vomiting, and has not been able to tolerate oral intake. She will be observed on the telemetry unit for ongoing evaluation and management of abdominal pain with vomiting and diarrhea suspected secondary to gastroenteritis versus colitis versus mild-moderate lupus flare.  Review of Systems:  All other systems reviewed and apart from HPI, are negative.  Past Medical History:  Diagnosis Date  . Anxiety   . Arthritis    rheumatoid  . Coronary artery disease   . Diverticulosis   . Gallstones    CT 07/28/15  . H. pylori infection   . Hyperlipidemia   . Hypertension   . Lupus ([place])   . Myocardial infarction [facility]) 2009    Past Surgical History:  Procedure Laterality Date  . MASTECTOMY       reports that she has been smoking.  She has been smoking about 0.25 packs per day. She has never used smokeless tobacco. She reports that she drinks alcohol. She reports that she uses drugs, including Marijuana.  No Known Allergies  Family History  Problem Relation Age of Onset  . Stomach cancer    . Breast cancer    . Heart disease    .  Stomach cancer Mother   . Breast cancer Sister   . Brain cancer Brother      Prior to Admission medications   Medication Sig Start Date End Date Taking? Authorizing Provider  ALPRAZolam (XANAX) 0.25 MG tablet Take 0.25 mg by mouth daily as needed. For when nervous    Yes Historical Provider, MD  ALPRAZolam Duanne Moron) 0.5  MG tablet Take 0.5 mg by mouth 3 (three) times daily as needed for anxiety.   Yes Historical Provider, MD  HYDROcodone-acetaminophen (NORCO) 7.5-325 MG tablet Take 1 tablet by mouth 3 (three) times daily. 07/03/15  Yes Historical Provider, MD  HYDROcodone-acetaminophen (NORCO/VICODIN) 5-325 MG tablet Take 1 tablet by mouth every 6 (six) hours as needed. 03/31/16  Yes Historical Provider, MD  losartan (COZAAR) 100 MG tablet Take 100 mg by mouth daily.     Yes Historical Provider, MD  metoprolol (LOPRESSOR) 100 MG tablet Take 100 mg by mouth 2 (two) times daily. Patient states she is taking a 25mg  along with the 100mg  tablet per patient   Yes Historical Provider, MD  pantoprazole (PROTONIX) 40 MG tablet Take 1 tablet (40 mg total) by mouth daily. 07/29/15  Yes Reyne Dumas, MD  saccharomyces boulardii (FLORASTOR) 250 MG capsule Take 1 capsule (250 mg total) by mouth 2 (two) times daily. 08/25/15  Yes Lori P Hvozdovic, PA-C  simvastatin (ZOCOR) 20 MG tablet Take 20 mg by mouth daily.   Yes Historical Provider, MD  aspirin EC 81 MG tablet Take 1 tablet (81 mg total) by mouth daily. 09/21/13   Liam Graham, PA-C  ciprofloxacin (CIPRO) 500 MG tablet Take 1 tablet (500 mg total) by mouth 2 (two) times daily. 07/29/15   Reyne Dumas, MD  hydrOXYzine (ATARAX/VISTARIL) 25 MG tablet Take 1 tablet (25 mg total) by mouth every 6 (six) hours. Patient not taking: Reported on 05/30/2016 07/16/15   Evelina Bucy, MD    Physical Exam: Vitals:   05/30/16 2015 05/30/16 2030 05/30/16 2045 05/30/16 2100  BP: 183/90 (!) 185/101 171/96 178/98  Pulse: 62 (!) 58 (!) 59 (!) 56  Resp: 15 19 16 21   Temp:      TempSrc:      SpO2: 98% 97% 99% 99%  Weight:      Height:          Constitutional: NAD, calm, red macules and patches noted diffusely about the face  Eyes: PERTLA, lids and conjunctivae normal ENMT: Mucous membranes are dry. Posterior pharynx clear of any exudate or lesions.   Neck: normal, supple, no masses,  no thyromegaly Respiratory: clear to auscultation bilaterally, no wheezing, no crackles. Normal respiratory effort.   Cardiovascular: S1 & S2 heard, regular rate and rhythm. No extremity edema. No carotid bruits. No significant JVD. Abdomen: No distension, no tenderness, no masses palpated. Bowel sounds normal.  Musculoskeletal: no clubbing / cyanosis. No joint deformity upper and lower extremities.    Skin: Aside from facial findings described above, no significant rashes, lesions, ulcers. Poor turgor. Neurologic: CN 2-12 grossly intact. Sensation intact, DTR normal. Strength 5/5 in all 4 limbs.  Psychiatric: Normal judgment and insight. Alert and oriented x 3. Normal mood and affect.     Labs on Admission: I have personally reviewed following labs and imaging studies  CBC:  Recent Labs Lab 05/30/16 1710  WBC 7.4  HGB 12.3  HCT 36.4  MCV 91.0  PLT [id number]   Basic Metabolic Panel:  Recent Labs Lab 05/30/16 1710 05/30/16 1934  NA 146*  --  K 2.9*  --   CL 114*  --   CO2 21*  --   GLUCOSE 114*  --   BUN 20  --   CREATININE 0.77  --   CALCIUM 9.4  --   MG  --  1.6*   GFR: Estimated Creatinine Clearance: 72.3 mL/min (by C-G formula based on SCr of 0.77 mg/dL). Liver Function Tests:  Recent Labs Lab 05/30/16 1710  AST 28  ALT 25  ALKPHOS 55  BILITOT 0.5  PROT 8.0  ALBUMIN 4.1    Recent Labs Lab 05/30/16 1710  LIPASE 21   No results for input(s): AMMONIA in the last 168 hours. Coagulation Profile: No results for input(s): INR, PROTIME in the last 168 hours. Cardiac Enzymes: No results for input(s): CKTOTAL, CKMB, CKMBINDEX, TROPONINI in the last 168 hours. BNP (last 3 results) No results for input(s): PROBNP in the last 8760 hours. HbA1C: No results for input(s): HGBA1C in the last 72 hours. CBG: No results for input(s): GLUCAP in the last 168 hours. Lipid Profile: No results for input(s): CHOL, HDL, LDLCALC, TRIG, CHOLHDL, LDLDIRECT in the last 72  hours. Thyroid Function Tests: No results for input(s): TSH, T4TOTAL, FREET4, T3FREE, THYROIDAB in the last 72 hours. Anemia Panel: No results for input(s): VITAMINB12, FOLATE, FERRITIN, TIBC, IRON, RETICCTPCT in the last 72 hours. Urine analysis:    Component Value Date/Time   COLORURINE YELLOW 05/30/2016 1845   APPEARANCEUR CLEAR 05/30/2016 1845   LABSPEC 1.014 05/30/2016 1845   PHURINE 6.0 05/30/2016 1845   GLUCOSEU NEGATIVE 05/30/2016 1845   GLUCOSEU NEG mg/dL 01/07/2007 2040   HGBUR NEGATIVE 05/30/2016 1845   BILIRUBINUR NEGATIVE 05/30/2016 1845   KETONESUR NEGATIVE 05/30/2016 1845   PROTEINUR NEGATIVE 05/30/2016 1845   UROBILINOGEN 0.2 07/28/2015 0103   NITRITE NEGATIVE 05/30/2016 1845   LEUKOCYTESUR NEGATIVE 05/30/2016 1845   Sepsis Labs: @LABRCNTIP (procalcitonin:4,lacticidven:4) )No results found for this or any previous visit (from the past 240 hour(s)).   Radiological Exams on Admission: Dg Chest Port 1 View  Result Date: 05/30/2016 CLINICAL DATA:  Fever.  Mid abdominal pain and vomiting.  Smoker. EXAM: PORTABLE CHEST 1 VIEW COMPARISON:  07/27/2015. FINDINGS: Normal sized heart. Clear lungs. Mildly prominent interstitial markings. Normal appearing bones. IMPRESSION: Mild chronic interstitial lung disease compatible with the history of smoking. No acute abnormality. Electronically Signed   By: Claudie Revering M.D.   On: 05/30/2016 16:07    EKG: Independently reviewed. Sinus rhythm.  Assessment/Plan  1. Abdominal pain with N/V/D  - Presented with pain in lower and mid abdomen; pain resolved in ED, but N/V persists and she has not tolerated oral intake  - DDx includes AGE, colitis, diverticulitis, or lupus flare; abdominal exam is completely benign by time of admission, making acute appendicitis or cholecystitis highly unlikely  - Lipase and LFT's wnl, making SLE-related hepatitis or pancreatitis unlikely  - UA was unremarkable; pt refused CT abd, citing concern for  radiation given multiple prior studies, but agrees to revisit if she worsens or fails to improve with current management  - Blood and urine cultures were collected in ED, GI pathogen panel ordered  - Empiric Zosyn was given in ED, will continue empiric abx with Cipro and Flagyl as pt is immunosuppressed  - Steroid for possible SLE flare as below  - Continue IVF hydration, monitor and replace lytes as needed, symptomatic care, infection-control with enteric precautions until diagnosis established or GI panel returns  - Start a liquid diet as tolerated  2. SLE with cutaneous flare  - Has been managed with prednisone 20 mg qD for past 1-2 weeks for a cutaneous flare; could not take on day of admission d/t recurrent vomiting  - Some concern for the GI sxs representing a lupus flare, though with normal LFT's and lipase, autoimmune hepatitis or pancreatitis highly unlikely  - Prednisone converted to IV Solu-Medrol and dose increased to 40 mg IV qD - Check inflammatory markers, anti-dsDNA, and complement C3 and C4    3. Hypokalemia, hypomagnesemia  - Serum potassium 2.9 and magnesium 1.6 on admission  - Likely secondary to GI-losses - 2 g IV mag and 10 mEq IV potassium given in ED  - 40 mEq KCl added to IVF  - Monitor on telemetry and repeat chem panel in am    4. Hypertension  - Elevated on admission in setting of N/V and not able to take her medications today d/t that  - Resume home Lopressor and losartan as tolerated  - IV hydralazine pushes available prn    5. GERD - No esophagitis on EGD from 2014   - Managed with Protonix at home, will resume once tolerated; IV Pepcid BID in the meantime    6. Alcohol dependence  - Pt denies hx of withdrawal and reports "only 2 beers or so" per day  - Pt's son had indicated to the EDP in private that pt drinks more than she reports  - Monitor with CIWA and prn Ativan    DVT prophylaxis: sq Lovenox  Code Status: Full  Family Communication:  Discussed with patient Disposition Plan: Observe on telemetry Consults called: None Admission status: Observation   Vianne Bulls, MD [facility] Pager [phone]  If 7PM-7AM, please contact night-coverage [email or web address] Password [facility]  05/30/2016, 9:55 PM

## 2016-05-30 NOTE — ED Notes (Signed)
Dr. Christy Gentles notified of elevated CG-4 of 2.52

## 2016-05-30 NOTE — Progress Notes (Signed)
Pharmacy Antibiotic Note  [name] is a 55 y.o. female admitted on 05/30/2016 with abdominal pain and fever  Pharmacy has been consulted for ciprofloxacin dosing. Afebrile, lactic acid elevated at 2.52. Renal function normal.   Plan: Ciprofloxacin 400 mg IV q12hr Monitor renal function, clinical picture, and culture results  Monitor QTc (last 490) Consider holding simvastatin while in ciprofloxacin due to drug interaction   Height: 5\' 5"  (165.1 cm) Weight: 132 lb (59.9 kg) IBW/kg (Calculated) : 57  Temp (24hrs), Avg:99.3 F (37.4 C), Min:98.4 F (36.9 C), Max:100.1 F (37.8 C)   Recent Labs Lab 05/30/16 1509 05/30/16 1710 05/30/16 1727  WBC  --  7.4  --   CREATININE  --  0.77  --   LATICACIDVEN 2.71*  --  2.52*    Estimated Creatinine Clearance: 72.3 mL/min (by C-G formula based on SCr of 0.77 mg/dL).    No Known Allergies  Antimicrobials this admission: 9/14 Cipro >>  9/14 Flagyl  >>  9/14 Zosyn x1   Dose adjustments this admission: N/A  Microbiology results: Pending   Thank you for allowing pharmacy to be a part of this patient's care.  Argie Ramming, PharmD Pharmacy Resident  Pager [phone] 05/30/16 10:21 PM

## 2016-05-30 NOTE — ED Triage Notes (Signed)
Pt states she woke up this morning with mid abd pain and chills. Pt also reports vomiting x10 since this am.

## 2016-05-30 NOTE — ED Provider Notes (Signed)
[facility] DEPT Provider Note   CSN: [id number] Arrival date & time: 05/30/16  1325     History   Chief Complaint Chief Complaint  Patient presents with  . Fever  . Abdominal Pain    HPI [name] is a 55 y.o. female.  The history is provided by the patient.  Fever   This is a new problem. The current episode started 12 to 24 hours ago. The problem occurs constantly. The problem has been gradually worsening. Associated symptoms include diarrhea and vomiting. Pertinent negatives include no chest pain.  Abdominal Pain   Associated symptoms include fever, diarrhea and vomiting.  Patient with h/o lupus presents with fever/vomiting/diarrhea and abdominal pain There is no blood in vomit/stool She reports similar episode in the past She felt well yesterday and this started over the past 12 hours She reports missing 2 doses of prednisone but no other med changes She ate [employer] last night for dinner then this started this morning   Son reports that she drank both natural light and budweiser yesterday and he feels this may have caused her vomiting She drinks ETOH most days of the week per son  Past Medical History:  Diagnosis Date  . Anxiety   . Arthritis    rheumatoid  . Coronary artery disease   . Diverticulosis   . Gallstones    CT 07/28/15  . H. pylori infection   . Hyperlipidemia   . Hypertension   . Lupus ([place])   . Myocardial infarction [facility]) 2009    Patient Active Problem List   Diagnosis Date Noted  . Dehydration   . Intractable vomiting with nausea 07/27/2015  . Lupus ([place]) 07/27/2015  . Diarrhea 07/27/2015  . Atypical chest pain 07/27/2015  . Hypercalcemia 07/27/2015  . INSOMNIA, CHRONIC 02/16/2009  . LEUKOCYTOSIS UNSPECIFIED 11/22/2008  . MYOCARDIAL INFARCTION, HX OF 07/04/2008  . 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 TOBACCO ABUSE 03/03/2008  . OTHER SPECIFIED DISORDERS OF LIVER 03/03/2008  . BACK PAIN, CHRONIC  01/07/2007  . FREQUENCY, URINARY 01/07/2007  . 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 MYOPATHY 09/01/2006  . Essential hypertension 09/01/2006  . GERD 09/01/2006  . MENOPAUSAL SYNDROME 09/01/2006  . SYSTEMIC LUPUS ERYTHEMATOSUS 09/01/2006  . LOW BACK PAIN 09/01/2006  . Myalgia and myositis, unspecified 09/01/2006  . OSTEOPENIA 09/01/2006    Past Surgical History:  Procedure Laterality Date  . MASTECTOMY      OB History    Gravida Para Term Preterm AB Living   4 2 2  0 2 2   SAB TAB Ectopic Multiple Live Births   0 2 0 0         Home Medications    Prior to Admission medications   Medication Sig Start Date End Date Taking? Authorizing Provider  ALPRAZolam (XANAX) 0.25 MG tablet Take 0.25 mg by mouth daily as needed. For when nervous    Yes Historical Provider, MD  ALPRAZolam Duanne Moron) 0.5 MG tablet Take 0.5 mg by mouth 3 (three) times daily as needed for anxiety.   Yes Historical Provider, MD  HYDROcodone-acetaminophen (NORCO) 7.5-325 MG tablet Take 1 tablet by mouth 3 (three) times daily. 07/03/15  Yes Historical Provider, MD  HYDROcodone-acetaminophen (NORCO/VICODIN) 5-325 MG tablet Take 1 tablet by mouth every 6 (six) hours as needed. 03/31/16  Yes Historical Provider, MD  losartan (COZAAR) 100 MG tablet Take 100 mg by mouth daily.     Yes Historical Provider, MD  metoprolol (LOPRESSOR) 100 MG tablet Take 100  mg by mouth 2 (two) times daily. Patient states she is taking a 25mg  along with the 100mg  tablet per patient   Yes Historical Provider, MD  pantoprazole (PROTONIX) 40 MG tablet Take 1 tablet (40 mg total) by mouth daily. 07/29/15  Yes Reyne Dumas, MD  saccharomyces boulardii (FLORASTOR) 250 MG capsule Take 1 capsule (250 mg total) by mouth 2 (two) times daily. 08/25/15  Yes Lori P Hvozdovic, PA-C  simvastatin (ZOCOR) 20 MG tablet Take 20 mg by mouth daily.   Yes Historical Provider, MD  aspirin EC 81 MG tablet Take 1 tablet (81 mg total) by mouth daily. 09/21/13   Liam Graham, PA-C    ciprofloxacin (CIPRO) 500 MG tablet Take 1 tablet (500 mg total) by mouth 2 (two) times daily. 07/29/15   Reyne Dumas, MD  hydrOXYzine (ATARAX/VISTARIL) 25 MG tablet Take 1 tablet (25 mg total) by mouth every 6 (six) hours. Patient not taking: Reported on 05/30/2016 07/16/15   Evelina Bucy, MD    Family History Family History  Problem Relation Age of Onset  . Stomach cancer    . Breast cancer    . Heart disease    . Stomach cancer Mother   . Breast cancer Sister   . Brain cancer Brother     Social History Social History  Substance Use Topics  . Smoking status: Current Every Day Smoker    Packs/day: 0.25  . Smokeless tobacco: Never Used     Comment: about 1/2 pack weekly  . Alcohol use 0.0 oz/week     Allergies   Review of patient's allergies indicates no known allergies.   Review of Systems Review of Systems  Constitutional: Positive for fatigue and fever.  Cardiovascular: Negative for chest pain.  Gastrointestinal: Positive for abdominal pain, diarrhea and vomiting. Negative for blood in stool.  All other systems reviewed and are negative.    Physical Exam Updated Vital Signs BP 157/82   Pulse (!) 54   Temp 100.1 F (37.8 C) (Oral)   Resp 20   Ht 5\' 5"  (1.651 m)   Wt 59.9 kg   SpO2 100%   BMI 21.97 kg/m   Physical Exam CONSTITUTIONAL: ill appearing, diaphoretic HEAD: Normocephalic/atraumatic EYES: EOMI/PERRL ENMT: Mucous membranes dry NECK: supple no meningeal signs SPINE/BACK:entire spine nontender CV: S1/S2 noted, no murmurs/rubs/gallops noted LUNGS: Lungs are clear to auscultation bilaterally, no apparent distress ABDOMEN: soft, mild diffuse tenderness GU:no cva tenderness NEURO: Pt is awake/alert/appropriate, moves all extremitiesx4.  No facial droop.   EXTREMITIES: pulses normal/equal, full ROM SKIN: warm,scattered rash noted to back (chronic per patient) PSYCH: no abnormalities of mood noted, alert and oriented to situation   ED  Treatments / Results  Labs (all labs ordered are listed, but only abnormal results are displayed) Labs Reviewed  COMPREHENSIVE METABOLIC PANEL - Abnormal; Notable for the following:       Result Value   Sodium 146 (*)    Potassium 2.9 (*)    Chloride 114 (*)    CO2 21 (*)    Glucose, Bld 114 (*)    All other components within normal limits  MAGNESIUM - Abnormal; Notable for the following:    Magnesium 1.6 (*)    All other components within normal limits  I-STAT CG4 LACTIC ACID, ED - Abnormal; Notable for the following:    Lactic Acid, Venous 2.71 (*)    All other components within normal limits  I-STAT CG4 LACTIC ACID, ED - Abnormal; Notable for the following:  Lactic Acid, Venous 2.52 (*)    All other components within normal limits  CULTURE, BLOOD (ROUTINE X 2)  CULTURE, BLOOD (ROUTINE X 2)  URINE CULTURE  LIPASE, BLOOD  CBC  URINALYSIS, ROUTINE W REFLEX MICROSCOPIC (NOT AT [facility])  ETHANOL    EKG  EKG Interpretation  Date/Time:  Thursday May 30 2016 19:45:38 EDT Ventricular Rate:  58 PR Interval:    QRS Duration: 98 QT Interval:  498 QTC Calculation: 490 R Axis:   59 Text Interpretation:  Sinus rhythm Borderline prolonged QT interval Abnormal ekg Confirmed by Christy Gentles  MD, Ronnie Mallette ([id number]) on 05/30/2016 7:51:05 PM       Radiology Dg Chest Port 1 View  Result Date: 05/30/2016 CLINICAL DATA:  Fever.  Mid abdominal pain and vomiting.  Smoker. EXAM: PORTABLE CHEST 1 VIEW COMPARISON:  07/27/2015. FINDINGS: Normal sized heart. Clear lungs. Mildly prominent interstitial markings. Normal appearing bones. IMPRESSION: Mild chronic interstitial lung disease compatible with the history of smoking. No acute abnormality. Electronically Signed   By: Claudie Revering M.D.   On: 05/30/2016 16:07    Procedures Procedures (including critical care time)  Medications Ordered in ED Medications  ondansetron (ZOFRAN-ODT) disintegrating tablet 4 mg (4 mg Oral Given 05/30/16 1338)   sodium chloride 0.9 % bolus 1,000 mL (0 mLs Intravenous Stopped 05/30/16 1850)    And  sodium chloride 0.9 % bolus 1,000 mL (0 mLs Intravenous Stopped 05/30/16 1936)  piperacillin-tazobactam (ZOSYN) IVPB 3.375 g (0 g Intravenous Stopped 05/30/16 1851)  ondansetron (ZOFRAN) injection 4 mg (4 mg Intravenous Given 05/30/16 1638)  fentaNYL (SUBLIMAZE) injection 100 mcg (100 mcg Intravenous Given 05/30/16 1642)  LORazepam (ATIVAN) injection 1 mg (1 mg Intravenous Given 05/30/16 1854)  ondansetron (ZOFRAN) injection 4 mg (4 mg Intravenous Given 05/30/16 1950)  potassium chloride 10 mEq in 100 mL IVPB (0 mEq Intravenous Stopped 05/30/16 2052)  magnesium sulfate IVPB 2 g 50 mL (0 g Intravenous Stopped 05/30/16 2057)     Initial Impression / Assessment and Plan / ED Course  I have reviewed the triage vital signs and the nursing notes.  Pertinent labs & imaging results that were available during my care of the patient were reviewed by me and considered in my medical decision making (see chart for details).  Clinical Course    Pt seen/examined She is dehydrated with vomitng/diarrhea Abdominal pain is improving with IV fluids and medications Code sepsis called initially due to presentation but now appears stabilized 7:30 PM Pt improved No focal abd tenderness Will defer CT imaging Pt is dehydrated/hypokalemic will correct this 9:24 PM Pt with continued nausea Due to persistent nausea, hypokalemia/hypomagnesmia - will admit Defer CT imaging as abd soft without focal tenderness D/w dr opyd for admission  Final Clinical Impressions(s) / ED Diagnoses   Final diagnoses:  Dehydration  Hypokalemia  Hypomagnesemia  Intractable vomiting with nausea, vomiting of unspecified type    New Prescriptions New Prescriptions   No medications on file     Ripley Fraise, MD 05/30/16 2125

## 2016-05-30 NOTE — ED Notes (Signed)
Nurse first made aware pt will need room. Pt laying in chair at this time. Attempted to draw blood unable to at this time. [facility]lebot[facility] unable to collect blood.

## 2016-05-31 ENCOUNTER — Encounter (HOSPITAL_COMMUNITY): Payer: Self-pay | Admitting: General Practice

## 2016-05-31 DIAGNOSIS — F1020 Alcohol dependence, uncomplicated: Secondary | ICD-10-CM | POA: Diagnosis not present

## 2016-05-31 DIAGNOSIS — Z800 Family history of malignant neoplasm of digestive organs: Secondary | ICD-10-CM | POA: Diagnosis not present

## 2016-05-31 DIAGNOSIS — N179 Acute kidney failure, unspecified: Secondary | ICD-10-CM | POA: Diagnosis not present

## 2016-05-31 DIAGNOSIS — Z7982 Long term (current) use of aspirin: Secondary | ICD-10-CM | POA: Diagnosis not present

## 2016-05-31 DIAGNOSIS — R1110 Vomiting, unspecified: Secondary | ICD-10-CM | POA: Diagnosis not present

## 2016-05-31 DIAGNOSIS — K219 Gastro-esophageal reflux disease without esophagitis: Secondary | ICD-10-CM | POA: Diagnosis not present

## 2016-05-31 DIAGNOSIS — R1084 Generalized abdominal pain: Secondary | ICD-10-CM | POA: Diagnosis present

## 2016-05-31 DIAGNOSIS — M329 Systemic lupus erythematosus, unspecified: Secondary | ICD-10-CM | POA: Diagnosis present

## 2016-05-31 DIAGNOSIS — Z79899 Other long term (current) drug therapy: Secondary | ICD-10-CM | POA: Diagnosis not present

## 2016-05-31 DIAGNOSIS — M0680 Other specified rheumatoid arthritis, unspecified site: Secondary | ICD-10-CM | POA: Diagnosis present

## 2016-05-31 DIAGNOSIS — R1030 Lower abdominal pain, unspecified: Secondary | ICD-10-CM | POA: Diagnosis not present

## 2016-05-31 DIAGNOSIS — F419 Anxiety disorder, unspecified: Secondary | ICD-10-CM | POA: Diagnosis present

## 2016-05-31 DIAGNOSIS — F17200 Nicotine dependence, unspecified, uncomplicated: Secondary | ICD-10-CM | POA: Diagnosis present

## 2016-05-31 DIAGNOSIS — Z23 Encounter for immunization: Secondary | ICD-10-CM | POA: Diagnosis not present

## 2016-05-31 DIAGNOSIS — E876 Hypokalemia: Secondary | ICD-10-CM | POA: Diagnosis present

## 2016-05-31 DIAGNOSIS — R112 Nausea with vomiting, unspecified: Secondary | ICD-10-CM | POA: Diagnosis present

## 2016-05-31 DIAGNOSIS — I10 Essential (primary) hypertension: Secondary | ICD-10-CM | POA: Diagnosis present

## 2016-05-31 DIAGNOSIS — I252 Old myocardial infarction: Secondary | ICD-10-CM | POA: Diagnosis not present

## 2016-05-31 DIAGNOSIS — E860 Dehydration: Secondary | ICD-10-CM | POA: Diagnosis not present

## 2016-05-31 DIAGNOSIS — E785 Hyperlipidemia, unspecified: Secondary | ICD-10-CM | POA: Diagnosis present

## 2016-05-31 DIAGNOSIS — I2510 Atherosclerotic heart disease of native coronary artery without angina pectoris: Secondary | ICD-10-CM | POA: Diagnosis present

## 2016-05-31 LAB — URINE CULTURE: Culture: <10000 — AB

## 2016-05-31 LAB — GLUCOSE, CAPILLARY: Glucose-Capillary: 122 mg/dL — ABNORMAL HIGH (ref 65–99)

## 2016-05-31 LAB — COMPREHENSIVE METABOLIC PANEL
ALT: 26 U/L (ref 14–54)
AST: 28 U/L (ref 15–41)
Albumin: 4.2 g/dL (ref 3.5–5.0)
Alkaline Phosphatase: 56 U/L (ref 38–126)
Anion gap: 11 (ref 5–15)
BUN: 12 mg/dL (ref 6–20)
CO2: 25 mmol/L (ref 22–32)
Calcium: 8.9 mg/dL (ref 8.9–10.3)
Chloride: 103 mmol/L (ref 101–111)
Creatinine, Ser: 0.81 mg/dL (ref 0.44–1.00)
GFR calc Af Amer: >60 mL/min (ref >60)
GFR calc non Af Amer: >60 mL/min (ref >60)
Glucose, Bld: 157 mg/dL — ABNORMAL HIGH (ref 65–99)
Potassium: 3.5 mmol/L (ref 3.5–5.1)
Sodium: 139 mmol/L (ref 135–145)
Total Bilirubin: 0.9 mg/dL (ref 0.3–1.2)
Total Protein: 8.3 g/dL — ABNORMAL HIGH (ref 6.5–8.1)

## 2016-05-31 LAB — TSH: TSH: 1.438 u[IU]/mL (ref 0.350–4.500)

## 2016-05-31 LAB — LACTIC ACID, PLASMA
LACTIC ACID, VENOUS: 1.2 mmol/L (ref 0.5–1.9)
Lactic Acid, Venous: 1.3 mmol/L (ref 0.5–1.9)

## 2016-05-31 LAB — C-REACTIVE PROTEIN: CRP: 0.7 mg/dL (ref <1.0)

## 2016-05-31 LAB — SEDIMENTATION RATE: SED RATE: 46 mm/h — AB (ref 0–22)

## 2016-05-31 LAB — MAGNESIUM: Magnesium: 2.3 mg/dL (ref 1.7–2.4)

## 2016-05-31 MED ORDER — POTASSIUM CHLORIDE CRYS ER 20 MEQ PO TBCR
40.0000 meq | EXTENDED_RELEASE_TABLET | Freq: Once | ORAL | Status: AC
  Administered 2016-05-31: 40 meq via ORAL
  Filled 2016-05-31: qty 2

## 2016-05-31 MED ORDER — INFLUENZA VAC SPLIT QUAD 0.5 ML IM SUSY
0.5000 mL | PREFILLED_SYRINGE | INTRAMUSCULAR | Status: AC
  Administered 2016-06-01: 0.5 mL via INTRAMUSCULAR
  Filled 2016-05-31: qty 0.5

## 2016-05-31 NOTE — Progress Notes (Signed)
PROGRESS NOTE  [name]  [id number] DOB: 12/03/1960 DOA: 05/30/2016 PCP: Barbette Merino, MD Outpatient Specialists:  Subjective: Slight nausea but no vomiting today. Denies any fever or chills. Denies any diarrhea  Brief Narrative:  [name] is a 55 y.o. female with medical history significant for lupus, hypertension, hyperlipidemia, anxiety, and coronary artery disease who presents to the emergency department for evaluation of abdominal pain with nausea, nonbloody vomiting, and nonbloody diarrhea. Patient reports that she had been suffering from a cutaneous flare in her lupus for the past 2 weeks and has been managed with 20 mg daily prednisone for more than a week now. She had otherwise been in her usual state of health until waking this morning with abdominal pain in the lower to mid abdomen and nausea. Patient went on to develop nonbloody, nonbilious vomiting and has had numerous episodes of this. She also reports a nonbloody diarrhea, also developed today, but she has this frequently for many years. She reports chills this morning, but denies any recent sick contacts or long distance travel. Pain was described as crampy, intermittent, better with vomiting or diarrhea, and with no exacerbating factors identified. She reports experiencing similar symptoms multiple times in the past, reports that she has been evaluated with CT of the abdomen many times with no significant findings, and refuses that today, but agrees to reconsider if she worsens or fails to improve with the current management.  Assessment & Plan:   Principal Problem:   Nausea & vomiting Active Problems:   TOBACCO ABUSE   Essential hypertension   GERD   Systemic lupus erythematosus (HCC)   Dehydration   Hypokalemia   Hypomagnesemia   Abdominal pain   Alcohol dependency (HCC)   Abdominal pain with N/V/D  - Presented with pain in lower and mid abdomen; pain resolved in ED, but N/V persists and she has not  tolerated oral intake  - DDx includes AGE, colitis, diverticulitis, or lupus flare; abdominal exam is completely benign by time of admission, making acute appendicitis or cholecystitis highly unlikely  - Lipase and LFT's wnl, making SLE-related hepatitis or pancreatitis unlikely  - UA was unremarkable; pt refused CT abd, citing concern for radiation given multiple prior studies, but agrees to revisit if she worsens or fails to improve with current management  - Blood and urine cultures were collected in ED, GI pathogen panel ordered  - Empiric Zosyn was given in ED, will continue empiric abx with Cipro and Flagyl as pt is immunosuppressed  - Steroid for possible SLE flare as below  - Continue IVF hydration, monitor and replace lytes as needed, symptomatic care, infection-control with enteric precautions until diagnosis established or GI panel returns  - Start a liquid diet, will advance as tolerated.  SLE with cutaneous flare  - Has been managed with prednisone 20 mg qD for past 1-2 weeks for a cutaneous flare; could not take on day of admission d/t recurrent vomiting  - Some concern for the GI sxs representing a lupus flare, though with normal LFT's and lipase, autoimmune hepatitis or pancreatitis highly unlikely  - Prednisone converted to IV Solu-Medrol and dose increased to 40 mg IV qD - Check inflammatory markers, anti-dsDNA, and complement C3 and C4    Hypokalemia, hypomagnesemia  - Serum potassium 2.9 and magnesium 1.6 on admission  - Repleted with oral supplements.  Hypertension  - Elevated on admission in setting of N/V and not able to take her medications today d/t that  - Resume home Lopressor and  losartan as tolerated  - IV hydralazine pushes available prn    GERD - No esophagitis on EGD from 2014   - Managed with Protonix at home, will resume once tolerated; IV Pepcid BID in the meantime    Alcohol dependence  - Pt denies hx of withdrawal and reports "only 2 beers or  so" per day  - Pt's son had indicated to the EDP in private that pt drinks more than she reports  - Monitor with CIWA and prn Ativan    DVT prophylaxis: Lovenox Code Status: Full Code Family Communication:  Disposition Plan:  Diet: Diet full liquid Room service appropriate? Yes; Fluid consistency: Thin  Consultants:   None  Procedures:   None  Antimicrobials:   Cipro and Flagyl   Objective: Vitals:   05/30/16 2330 05/31/16 0500 05/31/16 0554 05/31/16 1232  BP: (!) 187/74  102/67 (!) 178/80  Pulse: 61  80 63  Resp: 18  18   Temp: 98.2 F (36.8 C)  98.5 F (36.9 C)   TempSrc: Oral  Oral   SpO2: 100%  100%   Weight: 55.5 kg (122 lb 4.8 oz) 56.5 kg (124 lb 8 oz)    Height:        Intake/Output Summary (Last 24 hours) at 05/31/16 1246 Last data filed at 05/31/16 1236  Gross per 24 hour  Intake             1050 ml  Output             2100 ml  Net            -1050 ml   Filed Weights   05/30/16 1334 05/30/16 2330 05/31/16 0500  Weight: 59.9 kg (132 lb) 55.5 kg (122 lb 4.8 oz) 56.5 kg (124 lb 8 oz)    Examination: General exam: Appears calm and comfortable  Respiratory system: Clear to auscultation. Respiratory effort normal. Cardiovascular system: S1 & S2 heard, RRR. No JVD, murmurs, rubs, gallops or clicks. No pedal edema. Gastrointestinal system: Abdomen is nondistended, soft and nontender. No organomegaly or masses felt. Normal bowel sounds heard. Central nervous system: Alert and oriented. No focal neurological deficits. Extremities: Symmetric 5 x 5 power. Skin: No rashes, lesions or ulcers Psychiatry: Judgement and insight appear normal. Mood & affect appropriate.   Data Reviewed: I have personally reviewed following labs and imaging studies  CBC:  Recent Labs Lab 05/30/16 1710  WBC 7.4  HGB 12.3  HCT 36.4  MCV 91.0  PLT [id number]   Basic Metabolic Panel:  Recent Labs Lab 05/30/16 1710 05/30/16 1934 05/31/16 0220  NA 146*  --  139  K 2.9*  --   3.5  CL 114*  --  103  CO2 21*  --  25  GLUCOSE 114*  --  157*  BUN 20  --  12  CREATININE 0.77  --  0.81  CALCIUM 9.4  --  8.9  MG  --  1.6* 2.3   GFR: Estimated Creatinine Clearance: 70.8 mL/min (by C-G formula based on SCr of 0.81 mg/dL). Liver Function Tests:  Recent Labs Lab 05/30/16 1710 05/31/16 0220  AST 28 28  ALT 25 26  ALKPHOS 55 56  BILITOT 0.5 0.9  PROT 8.0 8.3*  ALBUMIN 4.1 4.2    Recent Labs Lab 05/30/16 1710  LIPASE 21   No results for input(s): AMMONIA in the last 168 hours. Coagulation Profile: No results for input(s): INR, PROTIME in the last 168 hours. Cardiac Enzymes:  No results for input(s): CKTOTAL, CKMB, CKMBINDEX, TROPONINI in the last 168 hours. BNP (last 3 results) No results for input(s): PROBNP in the last 8760 hours. HbA1C: No results for input(s): HGBA1C in the last 72 hours. CBG:  Recent Labs Lab 05/31/16 0657  GLUCAP 122*   Lipid Profile: No results for input(s): CHOL, HDL, LDLCALC, TRIG, CHOLHDL, LDLDIRECT in the last 72 hours. Thyroid Function Tests:  Recent Labs  05/30/16 2333  TSH 1.438   Anemia Panel: No results for input(s): VITAMINB12, FOLATE, FERRITIN, TIBC, IRON, RETICCTPCT in the last 72 hours. Urine analysis:    Component Value Date/Time   COLORURINE YELLOW 05/30/2016 1845   APPEARANCEUR CLEAR 05/30/2016 1845   LABSPEC 1.014 05/30/2016 1845   PHURINE 6.0 05/30/2016 1845   GLUCOSEU NEGATIVE 05/30/2016 1845   GLUCOSEU NEG mg/dL 01/07/2007 2040   HGBUR NEGATIVE 05/30/2016 1845   BILIRUBINUR NEGATIVE 05/30/2016 1845   KETONESUR NEGATIVE 05/30/2016 1845   PROTEINUR NEGATIVE 05/30/2016 1845   UROBILINOGEN 0.2 07/28/2015 0103   NITRITE NEGATIVE 05/30/2016 1845   LEUKOCYTESUR NEGATIVE 05/30/2016 1845   Sepsis Labs: @LABRCNTIP (procalcitonin:4,lacticidven:4)  )No results found for this or any previous visit (from the past 240 hour(s)).   Invalid input(s): PROCALCITONIN, LACTICACIDVEN   Radiology  Studies: Dg Chest Port 1 View  Result Date: 05/30/2016 CLINICAL DATA:  Fever.  Mid abdominal pain and vomiting.  Smoker. EXAM: PORTABLE CHEST 1 VIEW COMPARISON:  07/27/2015. FINDINGS: Normal sized heart. Clear lungs. Mildly prominent interstitial markings. Normal appearing bones. IMPRESSION: Mild chronic interstitial lung disease compatible with the history of smoking. No acute abnormality. Electronically Signed   By: Claudie Revering M.D.   On: 05/30/2016 16:07        Scheduled Meds: . aspirin EC  81 mg Oral Daily  . ciprofloxacin  400 mg Intravenous Q12H  . enoxaparin (LOVENOX) injection  40 mg Subcutaneous Q24H  . famotidine (PEPCID) IV  20 mg Intravenous Q12H  . folic acid  1 mg Oral Daily  . [START ON 06/01/2016] Influenza vac split quadrivalent PF  0.5 mL Intramuscular Tomorrow-1000  . LORazepam  0-4 mg Intravenous Q6H   Followed by  . [START ON 06/01/2016] LORazepam  0-4 mg Intravenous Q12H  . losartan  100 mg Oral Daily  . methylPREDNISolone (SOLU-MEDROL) injection  40 mg Intravenous Daily  . metoprolol tartrate  125 mg Oral BID  . metronidazole  500 mg Intravenous Q8H  . multivitamin with minerals  1 tablet Oral Daily  . nicotine  7 mg Transdermal Daily  . pantoprazole  40 mg Oral Daily  . simvastatin  20 mg Oral q1800  . sodium chloride flush  3 mL Intravenous Q12H  . thiamine  100 mg Oral Daily   Or  . thiamine  100 mg Intravenous Daily   Continuous Infusions:    LOS: 0 days    Time spent: 35 minutes    Alphonsine Minium A, MD [facility] Pager [phone]  If 7PM-7AM, please contact night-coverage [email or web address] Password [facility] 05/31/2016, 12:46 PM

## 2016-05-31 NOTE — Care Management Obs Status (Signed)
[place] NOTIFICATION   Patient Details  Name: [name] MRN: [id number] Date of Birth: 21-Apr-1961   Medicare Observation Status Notification Given:  Yes    Dawayne Patricia, RN 05/31/2016, 3:27 PM

## 2016-06-01 DIAGNOSIS — N179 Acute kidney failure, unspecified: Secondary | ICD-10-CM

## 2016-06-01 DIAGNOSIS — F17200 Nicotine dependence, unspecified, uncomplicated: Secondary | ICD-10-CM

## 2016-06-01 LAB — BASIC METABOLIC PANEL
ANION GAP: 10 (ref 5–15)
BUN: 16 mg/dL (ref 6–20)
CO2: 27 mmol/L (ref 22–32)
Calcium: 9.6 mg/dL (ref 8.9–10.3)
Chloride: 102 mmol/L (ref 101–111)
Creatinine, Ser: 1.43 mg/dL — ABNORMAL HIGH (ref 0.44–1.00)
GFR calc Af Amer: 47 mL/min — ABNORMAL LOW (ref >60)
GFR, EST NON AFRICAN AMERICAN: 41 mL/min — AB (ref >60)
GLUCOSE: 99 mg/dL (ref 65–99)
POTASSIUM: 4 mmol/L (ref 3.5–5.1)
Sodium: 139 mmol/L (ref 135–145)

## 2016-06-01 LAB — C DIFFICILE QUICK SCREEN W PCR REFLEX
C DIFFICILE (CDIFF) TOXIN: NEGATIVE
C Diff antigen: POSITIVE — AB

## 2016-06-01 LAB — GLUCOSE, CAPILLARY: Glucose-Capillary: 107 mg/dL — ABNORMAL HIGH (ref 65–99)

## 2016-06-01 LAB — C3 COMPLEMENT: C3 Complement: 95 mg/dL (ref 82–167)

## 2016-06-01 LAB — C4 COMPLEMENT: COMPLEMENT C4, BODY FLUID: 8 mg/dL — AB (ref 14–44)

## 2016-06-01 LAB — CLOSTRIDIUM DIFFICILE BY PCR: CDIFFPCR: NEGATIVE

## 2016-06-01 LAB — ANTI-DNA ANTIBODY, DOUBLE-STRANDED: ds DNA Ab: <1 IU/mL (ref 0–9)

## 2016-06-01 MED ORDER — SODIUM CHLORIDE 0.9 % IV SOLN
INTRAVENOUS | Status: DC
  Administered 2016-06-01 – 2016-06-03 (×2): via INTRAVENOUS

## 2016-06-01 NOTE — Progress Notes (Signed)
PROGRESS NOTE  [name]  BOF:[id number] DOB: 01-May-1961 DOA: 05/30/2016 PCP: Barbette Merino, MD Outpatient Specialists:  Subjective: Still complaining about nausea vomiting, but no diarrhea or abdominal pain. Creatinine increased to 1.43 this morning from 0.8.  Brief Narrative:  [name] is a 55 y.o. female with medical history significant for lupus, hypertension, hyperlipidemia, anxiety, and coronary artery disease who presents to the emergency department for evaluation of abdominal pain with nausea, nonbloody vomiting, and nonbloody diarrhea. Patient reports that she had been suffering from a cutaneous flare in her lupus for the past 2 weeks and has been managed with 20 mg daily prednisone for more than a week now. She had otherwise been in her usual state of health until waking this morning with abdominal pain in the lower to mid abdomen and nausea. Patient went on to develop nonbloody, nonbilious vomiting and has had numerous episodes of this. She also reports a nonbloody diarrhea, also developed today, but she has this frequently for many years. She reports chills this morning, but denies any recent sick contacts or long distance travel. Pain was described as crampy, intermittent, better with vomiting or diarrhea, and with no exacerbating factors identified. She reports experiencing similar symptoms multiple times in the past, reports that she has been evaluated with CT of the abdomen many times with no significant findings, and refuses that today, but agrees to reconsider if she worsens or fails to improve with the current management.  Assessment & Plan:   Principal Problem:   Nausea & vomiting Active Problems:   TOBACCO ABUSE   Essential hypertension   GERD   Systemic lupus erythematosus (HCC)   Dehydration   Hypokalemia   Hypomagnesemia   Abdominal pain   Alcohol dependency (HCC)   AKI (acute kidney injury) ([place])   Abdominal pain with N/V/D  - Presented with pain in  lower and mid abdomen; pain resolved in ED, but N/V persists and she has not tolerated oral intake  - DDx includes AGE, colitis, diverticulitis, or lupus flare; abdominal exam is completely benign by time of admission, making acute appendicitis or cholecystitis highly unlikely  - Lipase and LFT's wnl, making SLE-related hepatitis or pancreatitis unlikely  - UA was unremarkable; pt refused CT abd, citing concern for radiation given multiple prior studies, but agrees to revisit if she worsens or fails to improve with current management  - Blood and urine cultures were collected in ED, GI pathogen panel ordered  - Empiric Zosyn was given in ED, will continue empiric abx with Cipro and Flagyl as pt is immunosuppressed  - Steroid for possible SLE flare as below  - Continue IV fluid hydration, await GI pathogens panel. - Start a liquid diet, will advance as tolerated.  Acute kidney injury -Creatinine was at baseline of 0.8 yesterday, creatinine increased 1.4 this morning. -This could be secondary to dehydration, rehydrated with fluids, check BMP in a.m.  SLE with cutaneous flare  - Has been managed with prednisone 20 mg qD for past 1-2 weeks for a cutaneous flare; could not take on day of admission d/t recurrent vomiting  - Some concern for the GI sxs representing a lupus flare, though with normal LFT's and lipase, autoimmune hepatitis or pancreatitis highly unlikely  - Prednisone converted to IV Solu-Medrol and dose increased to 40 mg IV qD - Normal CRP, slightly elevated ESR, continue IV steroids.  Hypokalemia, hypomagnesemia  - Serum potassium 2.9 and magnesium 1.6 on admission  - Repleted with oral supplements.  Hypertension  -  Elevated on admission in setting of N/V and not able to take her medications today d/t that  - Resume home Lopressor and losartan as tolerated  - IV hydralazine pushes available prn    GERD - No esophagitis on EGD from 2014   - Managed with Protonix at home,  will resume once tolerated; IV Pepcid BID in the meantime    Alcohol dependence  - Pt denies hx of withdrawal and reports "only 2 beers or so" per day  - Pt's son had indicated to the EDP in private that pt drinks more than she reports  - Monitor with CIWA and prn Ativan    DVT prophylaxis: Lovenox Code Status: Full Code Family Communication:  Disposition Plan:  Diet: Diet full liquid Room service appropriate? Yes; Fluid consistency: Thin  Consultants:   None  Procedures:   None  Antimicrobials:   Cipro and Flagyl   Objective: Vitals:   05/31/16 1844 05/31/16 2051 05/31/16 2330 06/01/16 0340  BP: (!) 155/90 (!) 174/95 (!) 159/100 129/88  Pulse:  70  66  Resp:  18  18  Temp:  99.4 F (37.4 C)  98 F (36.7 C)  TempSrc:  Oral  Oral  SpO2:  100% 95% 100%  Weight:    54 kg (119 lb)  Height:        Intake/Output Summary (Last 24 hours) at 06/01/16 1107 Last data filed at 05/31/16 2100  Gross per 24 hour  Intake                0 ml  Output             1100 ml  Net            -1100 ml   Filed Weights   05/30/16 2330 05/31/16 0500 06/01/16 0340  Weight: 55.5 kg (122 lb 4.8 oz) 56.5 kg (124 lb 8 oz) 54 kg (119 lb)    Examination: General exam: Appears calm and comfortable  Respiratory system: Clear to auscultation. Respiratory effort normal. Cardiovascular system: S1 & S2 heard, RRR. No JVD, murmurs, rubs, gallops or clicks. No pedal edema. Gastrointestinal system: Abdomen is nondistended, soft and nontender. No organomegaly or masses felt. Normal bowel sounds heard. Central nervous system: Alert and oriented. No focal neurological deficits. Extremities: Symmetric 5 x 5 power. Skin: No rashes, lesions or ulcers Psychiatry: Judgement and insight appear normal. Mood & affect appropriate.   Data Reviewed: I have personally reviewed following labs and imaging studies  CBC:  Recent Labs Lab 05/30/16 1710  WBC 7.4  HGB 12.3  HCT 36.4  MCV 91.0  PLT 932    Basic Metabolic Panel:  Recent Labs Lab 05/30/16 1710 05/30/16 1934 05/31/16 0220 06/01/16 0357  NA 146*  --  139 139  K 2.9*  --  3.5 4.0  CL 114*  --  103 102  CO2 21*  --  25 27  GLUCOSE 114*  --  157* 99  BUN 20  --  12 16  CREATININE 0.77  --  0.81 1.43*  CALCIUM 9.4  --  8.9 9.6  MG  --  1.6* 2.3  --    GFR: Estimated Creatinine Clearance: 38.3 mL/min (by C-G formula based on SCr of 1.43 mg/dL (H)). Liver Function Tests:  Recent Labs Lab 05/30/16 1710 05/31/16 0220  AST 28 28  ALT 25 26  ALKPHOS 55 56  BILITOT 0.5 0.9  PROT 8.0 8.3*  ALBUMIN 4.1 4.2    Recent  Labs Lab 05/30/16 1710  LIPASE 21   No results for input(s): AMMONIA in the last 168 hours. Coagulation Profile: No results for input(s): INR, PROTIME in the last 168 hours. Cardiac Enzymes: No results for input(s): CKTOTAL, CKMB, CKMBINDEX, TROPONINI in the last 168 hours. BNP (last 3 results) No results for input(s): PROBNP in the last 8760 hours. HbA1C: No results for input(s): HGBA1C in the last 72 hours. CBG:  Recent Labs Lab 05/31/16 0657 06/01/16 0608  GLUCAP 122* 107*   Lipid Profile: No results for input(s): CHOL, HDL, LDLCALC, TRIG, CHOLHDL, LDLDIRECT in the last 72 hours. Thyroid Function Tests:  Recent Labs  05/30/16 2333  TSH 1.438   Anemia Panel: No results for input(s): VITAMINB12, FOLATE, FERRITIN, TIBC, IRON, RETICCTPCT in the last 72 hours. Urine analysis:    Component Value Date/Time   COLORURINE YELLOW 05/30/2016 1845   APPEARANCEUR CLEAR 05/30/2016 1845   LABSPEC 1.014 05/30/2016 1845   PHURINE 6.0 05/30/2016 1845   GLUCOSEU NEGATIVE 05/30/2016 1845   GLUCOSEU NEG mg/dL 01/07/2007 2040   HGBUR NEGATIVE 05/30/2016 1845   BILIRUBINUR NEGATIVE 05/30/2016 1845   KETONESUR NEGATIVE 05/30/2016 1845   PROTEINUR NEGATIVE 05/30/2016 1845   UROBILINOGEN 0.2 07/28/2015 0103   NITRITE NEGATIVE 05/30/2016 1845   LEUKOCYTESUR NEGATIVE 05/30/2016 1845   Sepsis  Labs: _0 (procalcitonin:4,lacticidven:4)  ) Recent Results (from the past 240 hour(s))  Blood Culture (routine x 2)     Status: None (Preliminary result)   Collection Time: 05/30/16  4:13 PM  Result Value Ref Range Status   Specimen Description BLOOD LEFT ARM  Final   Special Requests IN PEDIATRIC BOTTLE 1CC  Final   Culture NO GROWTH < 24 HOURS  Final   Report Status PENDING  Incomplete  Blood Culture (routine x 2)     Status: None (Preliminary result)   Collection Time: 05/30/16  4:48 PM  Result Value Ref Range Status   Specimen Description BLOOD LEFT ANTECUBITAL  Final   Special Requests IN PEDIATRIC BOTTLE 2CC  Final   Culture NO GROWTH < 24 HOURS  Final   Report Status PENDING  Incomplete  Urine culture     Status: Abnormal   Collection Time: 05/30/16  6:45 PM  Result Value Ref Range Status   Specimen Description URINE, RANDOM  Final   Special Requests NONE  Final   Culture <10,000 COLONIES/mL INSIGNIFICANT GROWTH (A)  Final   Report Status 05/31/2016 FINAL  Final     Invalid input(s): PROCALCITONIN, LACTICACIDVEN   Radiology Studies: Dg Chest Port 1 View  Result Date: 05/30/2016 CLINICAL DATA:  Fever.  Mid abdominal pain and vomiting.  Smoker. EXAM: PORTABLE CHEST 1 VIEW COMPARISON:  07/27/2015. FINDINGS: Normal sized heart. Clear lungs. Mildly prominent interstitial markings. Normal appearing bones. IMPRESSION: Mild chronic interstitial lung disease compatible with the history of smoking. No acute abnormality. Electronically Signed   By: Claudie Revering M.D.   On: 05/30/2016 16:07        Scheduled Meds: . aspirin EC  81 mg Oral Daily  . ciprofloxacin  400 mg Intravenous Q12H  . enoxaparin (LOVENOX) injection  40 mg Subcutaneous Q24H  . folic acid  1 mg Oral Daily  . LORazepam  0-4 mg Intravenous Q6H   Followed by  . LORazepam  0-4 mg Intravenous Q12H  . losartan  100 mg Oral Daily  . methylPREDNISolone (SOLU-MEDROL) injection  40 mg Intravenous Daily  .  metoprolol tartrate  125 mg Oral BID  . metronidazole  500 mg Intravenous Q8H  . multivitamin with minerals  1 tablet Oral Daily  . nicotine  7 mg Transdermal Daily  . pantoprazole  40 mg Oral Daily  . simvastatin  20 mg Oral q1800  . sodium chloride flush  3 mL Intravenous Q12H  . thiamine  100 mg Oral Daily   Continuous Infusions:    LOS: 1 day    Time spent: 35 minutes    Sukanya Goldblatt A, MD [facility] Pager [phone]  If 7PM-7AM, please contact night-coverage [email or web address] Password [email or web address] 06/01/2016, 11:07 AM

## 2016-06-02 ENCOUNTER — Inpatient Hospital Stay (HOSPITAL_COMMUNITY): Payer: Medicare Other

## 2016-06-02 LAB — BASIC METABOLIC PANEL
Anion gap: 10 (ref 5–15)
BUN: 37 mg/dL — AB (ref 6–20)
CALCIUM: 8.9 mg/dL (ref 8.9–10.3)
CHLORIDE: 99 mmol/L — AB (ref 101–111)
CO2: 22 mmol/L (ref 22–32)
CREATININE: 2.91 mg/dL — AB (ref 0.44–1.00)
GFR calc non Af Amer: 17 mL/min — ABNORMAL LOW (ref >60)
GFR, EST AFRICAN AMERICAN: 20 mL/min — AB (ref >60)
GLUCOSE: 89 mg/dL (ref 65–99)
Potassium: 3.7 mmol/L (ref 3.5–5.1)
Sodium: 131 mmol/L — ABNORMAL LOW (ref 135–145)

## 2016-06-02 LAB — URINALYSIS, ROUTINE W REFLEX MICROSCOPIC
Bilirubin Urine: NEGATIVE
GLUCOSE, UA: NEGATIVE mg/dL
Hgb urine dipstick: NEGATIVE
Ketones, ur: NEGATIVE mg/dL
LEUKOCYTES UA: NEGATIVE
NITRITE: NEGATIVE
PH: 5 (ref 5.0–8.0)
PROTEIN: NEGATIVE mg/dL
Specific Gravity, Urine: 1.003 — ABNORMAL LOW (ref 1.005–1.030)

## 2016-06-02 LAB — GLUCOSE, CAPILLARY
GLUCOSE-CAPILLARY: 123 mg/dL — AB (ref 65–99)
Glucose-Capillary: 88 mg/dL (ref 65–99)

## 2016-06-02 LAB — CREATININE, URINE, RANDOM: Creatinine, Urine: 35.29 mg/dL

## 2016-06-02 LAB — SODIUM, URINE, RANDOM: SODIUM UR: 16 mmol/L

## 2016-06-02 MED ORDER — ENOXAPARIN SODIUM 30 MG/0.3ML ~~LOC~~ SOLN
30.0000 mg | SUBCUTANEOUS | Status: DC
  Administered 2016-06-03: 30 mg via SUBCUTANEOUS
  Filled 2016-06-02: qty 0.3

## 2016-06-02 MED ORDER — CIPROFLOXACIN IN D5W 400 MG/200ML IV SOLN
400.0000 mg | INTRAVENOUS | Status: DC
  Administered 2016-06-03: 400 mg via INTRAVENOUS
  Filled 2016-06-02 (×2): qty 200

## 2016-06-02 NOTE — Progress Notes (Addendum)
PROGRESS NOTE  [name]  NTZ:[id number] DOB: 1961/01/18 DOA: 05/30/2016 PCP: Barbette Merino, MD Outpatient Specialists:  Subjective: Denies any nausea or vomiting this morning started to tolerate some oral intake. Creatinine continue to rise, 2.9 today, we'll obtain ultrasound and urinalysis if continues to worsen in a.m. we'll consult renal.  Brief Narrative:  [name] is a 55 y.o. female with medical history significant for lupus, hypertension, hyperlipidemia, anxiety, and coronary artery disease who presents to the emergency department for evaluation of abdominal pain with nausea, nonbloody vomiting, and nonbloody diarrhea. Patient reports that she had been suffering from a cutaneous flare in her lupus for the past 2 weeks and has been managed with 20 mg daily prednisone for more than a week now. She had otherwise been in her usual state of health until waking this morning with abdominal pain in the lower to mid abdomen and nausea. Patient went on to develop nonbloody, nonbilious vomiting and has had numerous episodes of this. She also reports a nonbloody diarrhea, also developed today, but she has this frequently for many years. She reports chills this morning, but denies any recent sick contacts or long distance travel. Pain was described as crampy, intermittent, better with vomiting or diarrhea, and with no exacerbating factors identified. She reports experiencing similar symptoms multiple times in the past, reports that she has been evaluated with CT of the abdomen many times with no significant findings, and refuses that today, but agrees to reconsider if she worsens or fails to improve with the current management.  Assessment & Plan:   Principal Problem:   Nausea & vomiting Active Problems:   TOBACCO ABUSE   Essential hypertension   GERD   Systemic lupus erythematosus (HCC)   Dehydration   Hypokalemia   Hypomagnesemia   Abdominal pain   Alcohol dependency (HCC)   AKI  (acute kidney injury) ([place])   Abdominal pain with N/V/D  - Presented with pain in lower and mid abdomen; pain resolved in ED, but N/V persists and she has not tolerated oral intake  - DDx includes AGE, colitis, diverticulitis, or lupus flare; abdominal exam is completely benign by time of admission, making acute appendicitis or cholecystitis highly unlikely  - Lipase and LFT's wnl, making SLE-related hepatitis or pancreatitis unlikely  - UA was unremarkable; pt refused CT abd, citing concern for radiation given multiple prior studies, but agrees to revisit if she worsens or fails to improve with current management  - Blood and urine cultures were collected in ED, GI pathogen panel ordered  - Empiric Zosyn was given in ED, will continue empiric abx with Cipro and Flagyl as pt is immunosuppressed  - Steroid for possible SLE flare as below  - Continue IV fluid hydration, await GI pathogens panel. - C. difficile studies shows positive antigen but negative toxin, this is likely colonization.  Acute kidney injury -Creatinine was at baseline of 0.8 yesterday, creatinine increased 2.9 this morning. -This is could be secondary to dehydration, started on IV fluids, creatinine continues to rise. -Obtain urinalysis and renal ultrasound, hold Cozaar, no NSAIDs or contrast medium.? Unclear of this related to SLE flare.  SLE with cutaneous flare  - Has been managed with prednisone 20 mg qD for past 1-2 weeks for a cutaneous flare; could not take on day of admission d/t recurrent vomiting  - Some concern for the GI sxs representing a lupus flare, though with normal LFT's and lipase, autoimmune hepatitis or pancreatitis highly unlikely  - Prednisone converted to IV Solu-Medrol  and dose increased to 40 mg IV qD - Normal CRP, slightly elevated ESR, continue IV steroids.  Hypokalemia, hypomagnesemia  - Serum potassium 2.9 and magnesium 1.6 on admission  - Repleted with oral supplements.  Hypertension  -  Elevated on admission in setting of N/V and not able to take her medications today d/t that  - Resume home Lopressor and losartan as tolerated  - IV hydralazine pushes available prn    GERD - No esophagitis on EGD from 2014   - Managed with Protonix at home, will resume once tolerated; IV Pepcid BID in the meantime    Alcohol dependence  - Pt denies hx of withdrawal and reports "only 2 beers or so" per day  - Pt's son had indicated to the EDP in private that pt drinks more than she reports  - Monitor with CIWA and prn Ativan    DVT prophylaxis: Lovenox Code Status: Full Code Family Communication:  Disposition Plan:  Diet: Diet regular Room service appropriate? Yes; Fluid consistency: Thin  Consultants:   None  Procedures:   None  Antimicrobials:   Cipro and Flagyl   Objective: Vitals:   06/01/16 1629 06/01/16 1934 06/02/16 0500 06/02/16 0608  BP: 100/70 114/77  127/65  Pulse: 70 70  64  Resp: '19 17  18  ' Temp: 98.8 F (37.1 C) 98.5 F (36.9 C)  97.9 F (36.6 C)  TempSrc: Oral Oral  Oral  SpO2: 100% 99%  100%  Weight:   55.9 kg (123 lb 4.8 oz)   Height:       No intake or output data in the 24 hours ending 06/02/16 1135 Filed Weights   05/31/16 0500 06/01/16 0340 06/02/16 0500  Weight: 56.5 kg (124 lb 8 oz) 54 kg (119 lb) 55.9 kg (123 lb 4.8 oz)    Examination: General exam: Appears calm and comfortable  Respiratory system: Clear to auscultation. Respiratory effort normal. Cardiovascular system: S1 & S2 heard, RRR. No JVD, murmurs, rubs, gallops or clicks. No pedal edema. Gastrointestinal system: Abdomen is nondistended, soft and nontender. No organomegaly or masses felt. Normal bowel sounds heard. Central nervous system: Alert and oriented. No focal neurological deficits. Extremities: Symmetric 5 x 5 power. Skin: No rashes, lesions or ulcers Psychiatry: Judgement and insight appear normal. Mood & affect appropriate.   Data Reviewed: I have personally  reviewed following labs and imaging studies  CBC:  Recent Labs Lab 05/30/16 1710  WBC 7.4  HGB 12.3  HCT 36.4  MCV 91.0  PLT 818   Basic Metabolic Panel:  Recent Labs Lab 05/30/16 1710 05/30/16 1934 05/31/16 0220 06/01/16 0357 06/02/16 0421  NA 146*  --  139 139 131*  K 2.9*  --  3.5 4.0 3.7  CL 114*  --  103 102 99*  CO2 21*  --  '25 27 22  ' GLUCOSE 114*  --  157* 99 89  BUN 20  --  12 16 37*  CREATININE 0.77  --  0.81 1.43* 2.91*  CALCIUM 9.4  --  8.9 9.6 8.9  MG  --  1.6* 2.3  --   --    GFR: Estimated Creatinine Clearance: 19.5 mL/min (by C-G formula based on SCr of 2.91 mg/dL (H)). Liver Function Tests:  Recent Labs Lab 05/30/16 1710 05/31/16 0220  AST 28 28  ALT 25 26  ALKPHOS 55 56  BILITOT 0.5 0.9  PROT 8.0 8.3*  ALBUMIN 4.1 4.2    Recent Labs Lab 05/30/16 1710  LIPASE  21   No results for input(s): AMMONIA in the last 168 hours. Coagulation Profile: No results for input(s): INR, PROTIME in the last 168 hours. Cardiac Enzymes: No results for input(s): CKTOTAL, CKMB, CKMBINDEX, TROPONINI in the last 168 hours. BNP (last 3 results) No results for input(s): PROBNP in the last 8760 hours. HbA1C: No results for input(s): HGBA1C in the last 72 hours. CBG:  Recent Labs Lab 05/31/16 0657 06/01/16 0608 06/02/16 0618  GLUCAP 122* 107* 88   Lipid Profile: No results for input(s): CHOL, HDL, LDLCALC, TRIG, CHOLHDL, LDLDIRECT in the last 72 hours. Thyroid Function Tests:  Recent Labs  05/30/16 2333  TSH 1.438   Anemia Panel: No results for input(s): VITAMINB12, FOLATE, FERRITIN, TIBC, IRON, RETICCTPCT in the last 72 hours. Urine analysis:    Component Value Date/Time   COLORURINE YELLOW 05/30/2016 1845   APPEARANCEUR CLEAR 05/30/2016 1845   LABSPEC 1.014 05/30/2016 1845   PHURINE 6.0 05/30/2016 1845   GLUCOSEU NEGATIVE 05/30/2016 1845   GLUCOSEU NEG mg/dL 01/07/2007 2040   HGBUR NEGATIVE 05/30/2016 1845   BILIRUBINUR NEGATIVE  05/30/2016 1845   KETONESUR NEGATIVE 05/30/2016 1845   PROTEINUR NEGATIVE 05/30/2016 1845   UROBILINOGEN 0.2 07/28/2015 0103   NITRITE NEGATIVE 05/30/2016 1845   LEUKOCYTESUR NEGATIVE 05/30/2016 1845   Sepsis Labs: '@LABRCNTIP' (procalcitonin:4,lacticidven:4)  ) Recent Results (from the past 240 hour(s))  Blood Culture (routine x 2)     Status: None (Preliminary result)   Collection Time: 05/30/16  4:13 PM  Result Value Ref Range Status   Specimen Description BLOOD LEFT ARM  Final   Special Requests IN PEDIATRIC BOTTLE 1CC  Final   Culture NO GROWTH 2 DAYS  Final   Report Status PENDING  Incomplete  Blood Culture (routine x 2)     Status: None (Preliminary result)   Collection Time: 05/30/16  4:48 PM  Result Value Ref Range Status   Specimen Description BLOOD LEFT ANTECUBITAL  Final   Special Requests IN PEDIATRIC BOTTLE 2CC  Final   Culture NO GROWTH 2 DAYS  Final   Report Status PENDING  Incomplete  Urine culture     Status: Abnormal   Collection Time: 05/30/16  6:45 PM  Result Value Ref Range Status   Specimen Description URINE, RANDOM  Final   Special Requests NONE  Final   Culture <10,000 COLONIES/mL INSIGNIFICANT GROWTH (A)  Final   Report Status 05/31/2016 FINAL  Final  C difficile quick scan w PCR reflex     Status: Abnormal   Collection Time: 06/01/16 10:20 AM  Result Value Ref Range Status   C Diff antigen POSITIVE (A) NEGATIVE Final   C Diff toxin NEGATIVE NEGATIVE Final   C Diff interpretation Results are indeterminate. See PCR results.  Final  Clostridium Difficile by PCR     Status: None   Collection Time: 06/01/16 10:20 AM  Result Value Ref Range Status   Toxigenic C Difficile by pcr NEGATIVE NEGATIVE Final    Comment: Patient is colonized with non toxigenic C. difficile. May not need treatment unless significant symptoms are present.     Invalid input(s): PROCALCITONIN, [place]   Radiology Studies: No results found.      Scheduled Meds: .  aspirin EC  81 mg Oral Daily  . [START ON 06/03/2016] ciprofloxacin  400 mg Intravenous Q24H  . [START ON 06/03/2016] enoxaparin (LOVENOX) injection  30 mg Subcutaneous Q24H  . folic acid  1 mg Oral Daily  . LORazepam  0-4 mg Intravenous Q12H  .  losartan  100 mg Oral Daily  . methylPREDNISolone (SOLU-MEDROL) injection  40 mg Intravenous Daily  . metoprolol tartrate  125 mg Oral BID  . metronidazole  500 mg Intravenous Q8H  . multivitamin with minerals  1 tablet Oral Daily  . nicotine  7 mg Transdermal Daily  . pantoprazole  40 mg Oral Daily  . simvastatin  20 mg Oral q1800  . sodium chloride flush  3 mL Intravenous Q12H  . thiamine  100 mg Oral Daily   Continuous Infusions: . sodium chloride 100 mL/hr at 06/01/16 1644     LOS: 2 days    Time spent: 35 minutes    Naima Veldhuizen A, MD [facility] Pager [phone]  If 7PM-7AM, please contact night-coverage [email or web address] Password [email or web address] 06/02/2016, 11:35 AM

## 2016-06-03 LAB — BASIC METABOLIC PANEL
Anion gap: 8 (ref 5–15)
BUN: 42 mg/dL — AB (ref 6–20)
CHLORIDE: 109 mmol/L (ref 101–111)
CO2: 19 mmol/L — AB (ref 22–32)
CREATININE: 3.33 mg/dL — AB (ref 0.44–1.00)
Calcium: 8.5 mg/dL — ABNORMAL LOW (ref 8.9–10.3)
GFR calc Af Amer: 17 mL/min — ABNORMAL LOW (ref >60)
GFR calc non Af Amer: 15 mL/min — ABNORMAL LOW (ref >60)
Glucose, Bld: 79 mg/dL (ref 65–99)
POTASSIUM: 3.6 mmol/L (ref 3.5–5.1)
SODIUM: 136 mmol/L (ref 135–145)

## 2016-06-03 LAB — GLUCOSE, CAPILLARY
GLUCOSE-CAPILLARY: 80 mg/dL (ref 65–99)
Glucose-Capillary: 91 mg/dL (ref 65–99)

## 2016-06-03 NOTE — Discharge Summary (Signed)
Physician Discharge Summary  [name] [id number] DOB: 03/26/1961 DOA: 05/30/2016  PCP: Barbette Merino, MD  Admit date: 05/30/2016 Discharge date: 06/03/2016  Admitted From: Home Disposition: Left AMA  Recommendations for Outpatient Follow-up:  1. Follow up with PCP in 1-2 weeks 2. Please obtain BMP/CBC in one week 3.  Home Health: NA Equipment/Devices: NA  Discharge Condition: CODE STATUS: Diet recommendation:   Brief/Interim Summary: [name] a 55 y.o.femalewith medical history significant forlupus, hypertension, hyperlipidemia, anxiety, and coronary artery disease who presents to the emergency department for evaluation of abdominal pain with nausea, nonbloody vomiting, and nonbloody diarrhea. Patient reports that she had been suffering from a cutaneous flare in her lupus for the past 2 weeks and has been managed with 20 mg daily prednisone for more than a week now. She had otherwise been in her usual state of health until waking this morning with abdominal pain in the lower to mid abdomen and nausea. Patient went on to develop nonbloody, nonbilious vomiting and has had numerous episodes of this. She also reports a nonbloody diarrhea, also developed today, but she has this frequently for many years. She reports chills this morning, but denies any recent sick contacts or long distance travel. Pain was described as crampy, intermittent, better with vomiting or diarrhea, and with no exacerbating factors identified. She reports experiencing similar symptoms multiple times in the past, reports that she has been evaluated with CT of the abdomen many times with no significant findings, and refuses that today, but agrees to reconsider if she worsens or fails to improve with the current management  Discharge Diagnoses:  Principal Problem:   Nausea & vomiting Active Problems:   TOBACCO ABUSE   Essential hypertension   GERD   Systemic lupus erythematosus ([place])   Dehydration    Hypokalemia   Hypomagnesemia   Abdominal pain   Alcohol dependency ([place])   AKI (acute kidney injury) ([place])   Left AMA  Discharge Instructions  Left AMA  No Known Allergies  Consultations:     Procedures/Studies: US Renal  Result Date: 06/02/2016 CLINICAL DATA:  Acute kidney injury, hypertension, coronary artery disease post MI, lupus, fibromyalgia, rheumatoid arthritis, smoker EXAM: RENAL / URINARY TRACT ULTRASOUND COMPLETE COMPARISON:  CT abdomen and pelvis 07/28/2015 FINDINGS: Right Kidney: Length: 10.9 cm. Normal cortical thickness and echogenicity. No mass, hydronephrosis or shadowing calcification. Left Kidney: Length: 11.2 cm. Normal cortical thickness and echogenicity. No mass, hydronephrosis, or shadowing calcification. Bladder: Appears normal for degree of bladder distention. IMPRESSION: Normal renal ultrasound. Electronically Signed   By: Lavonia Dana M.D.   On: 06/02/2016 14:55   Dg Chest Port 1 View  Result Date: 05/30/2016 CLINICAL DATA:  Fever.  Mid abdominal pain and vomiting.  Smoker. EXAM: PORTABLE CHEST 1 VIEW COMPARISON:  07/27/2015. FINDINGS: Normal sized heart. Clear lungs. Mildly prominent interstitial markings. Normal appearing bones. IMPRESSION: Mild chronic interstitial lung disease compatible with the history of smoking. No acute abnormality. Electronically Signed   By: Claudie Revering M.D.   On: 05/30/2016 16:07    (Echo, Carotid, EGD, Colonoscopy, ERCP)    Subjective:   Discharge Exam: Vitals:   06/03/16 0420 06/03/16 1115  BP: (!) 148/90 130/88  Pulse: 61 63  Resp: 16 14  Temp: 97.6 F (36.4 C) 97.7 F (36.5 C)   Vitals:   06/02/16 1503 06/02/16 1940 06/03/16 0420 06/03/16 1115  BP: 102/66 136/80 (!) 148/90 130/88  Pulse: 68 64 61 63  Resp: 18 18 16 14   Temp: 97.7 F (36.5  C) 98.1 F (36.7 C) 97.6 F (36.4 C) 97.7 F (36.5 C)  TempSrc: Oral Oral Oral Oral  SpO2: 100% 100% 100% 100%  Weight:   60.5 kg (133 lb 4.8 oz)   Height:         General: Pt is alert, awake, not in acute distress Cardiovascular: RRR, S1/S2 +, no rubs, no gallops Respiratory: CTA bilaterally, no wheezing, no rhonchi Abdominal: Soft, NT, ND, bowel sounds + Extremities: no edema, no cyanosis    The results of significant diagnostics from this hospitalization (including imaging, microbiology, ancillary and laboratory) are listed below for reference.     Microbiology: Recent Results (from the past 240 hour(s))  Blood Culture (routine x 2)     Status: None (Preliminary result)   Collection Time: 05/30/16  4:13 PM  Result Value Ref Range Status   Specimen Description BLOOD LEFT ARM  Final   Special Requests IN PEDIATRIC BOTTLE 1CC  Final   Culture NO GROWTH 4 DAYS  Final   Report Status PENDING  Incomplete  Blood Culture (routine x 2)     Status: None (Preliminary result)   Collection Time: 05/30/16  4:48 PM  Result Value Ref Range Status   Specimen Description BLOOD LEFT ANTECUBITAL  Final   Special Requests IN PEDIATRIC BOTTLE 2CC  Final   Culture NO GROWTH 4 DAYS  Final   Report Status PENDING  Incomplete  Urine culture     Status: Abnormal   Collection Time: 05/30/16  6:45 PM  Result Value Ref Range Status   Specimen Description URINE, RANDOM  Final   Special Requests NONE  Final   Culture <10,000 COLONIES/mL INSIGNIFICANT GROWTH (A)  Final   Report Status 05/31/2016 FINAL  Final  C difficile quick scan w PCR reflex     Status: Abnormal   Collection Time: 06/01/16 10:20 AM  Result Value Ref Range Status   C Diff antigen POSITIVE (A) NEGATIVE Final   C Diff toxin NEGATIVE NEGATIVE Final   C Diff interpretation Results are indeterminate. See PCR results.  Final  Clostridium Difficile by PCR     Status: None   Collection Time: 06/01/16 10:20 AM  Result Value Ref Range Status   Toxigenic C Difficile by pcr NEGATIVE NEGATIVE Final    Comment: Patient is colonized with non toxigenic C. difficile. May not need treatment unless  significant symptoms are present.     Labs: BNP (last 3 results) No results for input(s): BNP in the last 8760 hours. Basic Metabolic Panel:  Recent Labs Lab 05/30/16 1710 05/30/16 1934 05/31/16 0220 06/01/16 0357 06/02/16 0421 06/03/16 0315  NA 146*  --  139 139 131* 136  K 2.9*  --  3.5 4.0 3.7 3.6  CL 114*  --  103 102 99* 109  CO2 21*  --  25 27 22  19*  GLUCOSE 114*  --  157* 99 89 79  BUN 20  --  12 16 37* 42*  CREATININE 0.77  --  0.81 1.43* 2.91* 3.33*  CALCIUM 9.4  --  8.9 9.6 8.9 8.5*  MG  --  1.6* 2.3  --   --   --    Liver Function Tests:  Recent Labs Lab 05/30/16 1710 05/31/16 0220  AST 28 28  ALT 25 26  ALKPHOS 55 56  BILITOT 0.5 0.9  PROT 8.0 8.3*  ALBUMIN 4.1 4.2    Recent Labs Lab 05/30/16 1710  LIPASE 21   No results for input(s): AMMONIA  in the last 168 hours. CBC:  Recent Labs Lab 05/30/16 1710  WBC 7.4  HGB 12.3  HCT 36.4  MCV 91.0  PLT 268   Cardiac Enzymes: No results for input(s): CKTOTAL, CKMB, CKMBINDEX, TROPONINI in the last 168 hours. BNP: Invalid input(s): POCBNP CBG:  Recent Labs Lab 06/01/16 0608 06/02/16 0618 06/02/16 1629 06/03/16 0634 06/03/16 1133  GLUCAP 107* 88 123* 80 91   D-Dimer No results for input(s): DDIMER in the last 72 hours. Hgb A1c No results for input(s): HGBA1C in the last 72 hours. Lipid Profile No results for input(s): CHOL, HDL, LDLCALC, TRIG, CHOLHDL, LDLDIRECT in the last 72 hours. Thyroid function studies No results for input(s): TSH, T4TOTAL, T3FREE, THYROIDAB in the last 72 hours.  Invalid input(s): FREET3 Anemia work up No results for input(s): VITAMINB12, FOLATE, FERRITIN, TIBC, IRON, RETICCTPCT in the last 72 hours. Urinalysis    Component Value Date/Time   COLORURINE YELLOW 06/02/2016 1521   APPEARANCEUR CLEAR 06/02/2016 1521   LABSPEC 1.003 (L) 06/02/2016 1521   PHURINE 5.0 06/02/2016 1521   GLUCOSEU NEGATIVE 06/02/2016 1521   GLUCOSEU NEG mg/dL 01/07/2007 2040    HGBUR NEGATIVE 06/02/2016 1521   BILIRUBINUR NEGATIVE 06/02/2016 1521   KETONESUR NEGATIVE 06/02/2016 1521   PROTEINUR NEGATIVE 06/02/2016 1521   UROBILINOGEN 0.2 07/28/2015 0103   NITRITE NEGATIVE 06/02/2016 1521   LEUKOCYTESUR NEGATIVE 06/02/2016 1521   Sepsis Labs Invalid input(s): PROCALCITONIN,  WBC,  LACTICIDVEN Microbiology Recent Results (from the past 240 hour(s))  Blood Culture (routine x 2)     Status: None (Preliminary result)   Collection Time: 05/30/16  4:13 PM  Result Value Ref Range Status   Specimen Description BLOOD LEFT ARM  Final   Special Requests IN PEDIATRIC BOTTLE 1CC  Final   Culture NO GROWTH 4 DAYS  Final   Report Status PENDING  Incomplete  Blood Culture (routine x 2)     Status: None (Preliminary result)   Collection Time: 05/30/16  4:48 PM  Result Value Ref Range Status   Specimen Description BLOOD LEFT ANTECUBITAL  Final   Special Requests IN PEDIATRIC BOTTLE 2CC  Final   Culture NO GROWTH 4 DAYS  Final   Report Status PENDING  Incomplete  Urine culture     Status: Abnormal   Collection Time: 05/30/16  6:45 PM  Result Value Ref Range Status   Specimen Description URINE, RANDOM  Final   Special Requests NONE  Final   Culture <10,000 COLONIES/mL INSIGNIFICANT GROWTH (A)  Final   Report Status 05/31/2016 FINAL  Final  C difficile quick scan w PCR reflex     Status: Abnormal   Collection Time: 06/01/16 10:20 AM  Result Value Ref Range Status   C Diff antigen POSITIVE (A) NEGATIVE Final   C Diff toxin NEGATIVE NEGATIVE Final   C Diff interpretation Results are indeterminate. See PCR results.  Final  Clostridium Difficile by PCR     Status: None   Collection Time: 06/01/16 10:20 AM  Result Value Ref Range Status   Toxigenic C Difficile by pcr NEGATIVE NEGATIVE Final    Comment: Patient is colonized with non toxigenic C. difficile. May not need treatment unless significant symptoms are present.     Time coordinating discharge: Over 30  minutes  SIGNED:   Birdie Hopes, MD  [facility] 06/03/2016, 5:06 PM Pager   If 7PM-7AM, please contact night-coverage [email or web address] Password [email or web address]

## 2016-06-03 NOTE — Progress Notes (Signed)
PROGRESS NOTE  [name]  YFV:[id number] DOB: 1961/02/04 DOA: 05/30/2016 PCP: Barbette Merino, MD Outpatient Specialists:  Subjective: Feels much better, no nausea or vomiting. Creatinine went up to 3.3.  Brief Narrative:  [name] is a 55 y.o. female with medical history significant for lupus, hypertension, hyperlipidemia, anxiety, and coronary artery disease who presents to the emergency department for evaluation of abdominal pain with nausea, nonbloody vomiting, and nonbloody diarrhea. Patient reports that she had been suffering from a cutaneous flare in her lupus for the past 2 weeks and has been managed with 20 mg daily prednisone for more than a week now. She had otherwise been in her usual state of health until waking this morning with abdominal pain in the lower to mid abdomen and nausea. Patient went on to develop nonbloody, nonbilious vomiting and has had numerous episodes of this. She also reports a nonbloody diarrhea, also developed today, but she has this frequently for many years. She reports chills this morning, but denies any recent sick contacts or long distance travel. Pain was described as crampy, intermittent, better with vomiting or diarrhea, and with no exacerbating factors identified. She reports experiencing similar symptoms multiple times in the past, reports that she has been evaluated with CT of the abdomen many times with no significant findings, and refuses that today, but agrees to reconsider if she worsens or fails to improve with the current management.  Assessment & Plan:   Principal Problem:   Nausea & vomiting Active Problems:   TOBACCO ABUSE   Essential hypertension   GERD   Systemic lupus erythematosus ([facility])   Dehydration   Hypokalemia   Hypomagnesemia   Abdominal pain   Alcohol dependency ([facility])   AKI (acute kidney injury) ([place])   Abdominal pain with N/V/D  - Presented with pain in lower and mid abdomen; pain resolved in ED, but N/V persists  and she has not tolerated oral intake  - DDx includes AGE, colitis, diverticulitis, or lupus flare; abdominal exam is completely benign by time of admission, making acute appendicitis or cholecystitis highly unlikely  - Lipase and LFT's wnl, making SLE-related hepatitis or pancreatitis unlikely  - UA was unremarkable; pt refused CT abd, citing concern for radiation given multiple prior studies, but agrees to revisit if she worsens or fails to improve with current management  - Blood and urine cultures were collected in ED, GI pathogen panel ordered  - Empiric Zosyn was given in ED, will continue empiric abx with Cipro and Flagyl as pt is immunosuppressed  - Steroid for possible SLE flare as below  - Continue IV fluid hydration, await GI pathogens panel. - C. difficile studies shows positive antigen but negative toxin, this is likely colonization.  Acute kidney injury -Creatinine was at baseline of 0.8 yesterday, creatinine increased 2.9 this morning. -This is could be secondary to dehydration, started on IV fluids, creatinine continues to rise. -Renal ultrasound without acute findings, urinalysis clear. -Patient was on Cozaar and blood pressure was elevated, but no other reasons for AKI. -Nephrology to evaluate.  SLE with cutaneous flare  - Has been managed with prednisone 20 mg qD for past 1-2 weeks for a cutaneous flare; could not take on day of admission d/t recurrent vomiting  - Some concern for the GI sxs representing a lupus flare, though with normal LFT's and lipase, autoimmune hepatitis or pancreatitis highly unlikely  - Prednisone converted to IV Solu-Medrol and dose increased to 40 mg IV qD - Normal CRP, slightly elevated ESR, continue  IV steroids.  Hypokalemia, hypomagnesemia  - Serum potassium 2.9 and magnesium 1.6 on admission  - Repleted with oral supplements.  Hypertension  - Elevated on admission in setting of N/V and not able to take her medications today d/t that  -  Resume home Lopressor, losartan currently on hold because of elevated creatinine. - IV hydralazine pushes available prn    GERD - No esophagitis on EGD from 2014   - Managed with Protonix at home, will resume once tolerated; IV Pepcid BID in the meantime    Alcohol dependence  - Pt denies hx of withdrawal and reports "only 2 beers or so" per day  - Pt's son had indicated to the EDP in private that pt drinks more than she reports  - Monitor with CIWA and prn Ativan    DVT prophylaxis: Lovenox Code Status: Full Code Family Communication:  Disposition Plan:  Diet: Diet regular Room service appropriate? Yes; Fluid consistency: Thin  Consultants:   None  Procedures:   None  Antimicrobials:   Cipro and Flagyl   Objective: Vitals:   06/02/16 1503 06/02/16 1940 06/03/16 0420 06/03/16 1115  BP: 102/66 136/80 (!) 148/90 130/88  Pulse: 68 64 61 63  Resp: '18 18 16 14  ' Temp: 97.7 F (36.5 C) 98.1 F (36.7 C) 97.6 F (36.4 C) 97.7 F (36.5 C)  TempSrc: Oral Oral Oral Oral  SpO2: 100% 100% 100% 100%  Weight:   60.5 kg (133 lb 4.8 oz)   Height:        Intake/Output Summary (Last 24 hours) at 06/03/16 1224 Last data filed at 06/02/16 2000  Gross per 24 hour  Intake              360 ml  Output              300 ml  Net               60 ml   Filed Weights   06/01/16 0340 06/02/16 0500 06/03/16 0420  Weight: 54 kg (119 lb) 55.9 kg (123 lb 4.8 oz) 60.5 kg (133 lb 4.8 oz)    Examination: General exam: Appears calm and comfortable  Respiratory system: Clear to auscultation. Respiratory effort normal. Cardiovascular system: S1 & S2 heard, RRR. No JVD, murmurs, rubs, gallops or clicks. No pedal edema. Gastrointestinal system: Abdomen is nondistended, soft and nontender. No organomegaly or masses felt. Normal bowel sounds heard. Central nervous system: Alert and oriented. No focal neurological deficits. Extremities: Symmetric 5 x 5 power. Skin: No rashes, lesions or  ulcers Psychiatry: Judgement and insight appear normal. Mood & affect appropriate.   Data Reviewed: I have personally reviewed following labs and imaging studies  CBC:  Recent Labs Lab 05/30/16 1710  WBC 7.4  HGB 12.3  HCT 36.4  MCV 91.0  PLT 443   Basic Metabolic Panel:  Recent Labs Lab 05/30/16 1710 05/30/16 1934 05/31/16 0220 06/01/16 0357 06/02/16 0421 06/03/16 0315  NA 146*  --  139 139 131* 136  K 2.9*  --  3.5 4.0 3.7 3.6  CL 114*  --  103 102 99* 109  CO2 21*  --  '25 27 22 ' 19*  GLUCOSE 114*  --  157* 99 89 79  BUN 20  --  12 16 37* 42*  CREATININE 0.77  --  0.81 1.43* 2.91* 3.33*  CALCIUM 9.4  --  8.9 9.6 8.9 8.5*  MG  --  1.6* 2.3  --   --   --  GFR: Estimated Creatinine Clearance: 17.4 mL/min (by C-G formula based on SCr of 3.33 mg/dL (H)). Liver Function Tests:  Recent Labs Lab 05/30/16 1710 05/31/16 0220  AST 28 28  ALT 25 26  ALKPHOS 55 56  BILITOT 0.5 0.9  PROT 8.0 8.3*  ALBUMIN 4.1 4.2    Recent Labs Lab 05/30/16 1710  LIPASE 21   No results for input(s): AMMONIA in the last 168 hours. Coagulation Profile: No results for input(s): INR, PROTIME in the last 168 hours. Cardiac Enzymes: No results for input(s): CKTOTAL, CKMB, CKMBINDEX, TROPONINI in the last 168 hours. BNP (last 3 results) No results for input(s): PROBNP in the last 8760 hours. HbA1C: No results for input(s): HGBA1C in the last 72 hours. CBG:  Recent Labs Lab 06/01/16 0608 06/02/16 0618 06/02/16 1629 06/03/16 0634 06/03/16 1133  GLUCAP 107* 88 123* 80 91   Lipid Profile: No results for input(s): CHOL, HDL, LDLCALC, TRIG, CHOLHDL, LDLDIRECT in the last 72 hours. Thyroid Function Tests: No results for input(s): TSH, T4TOTAL, FREET4, T3FREE, THYROIDAB in the last 72 hours. Anemia Panel: No results for input(s): VITAMINB12, FOLATE, FERRITIN, TIBC, IRON, RETICCTPCT in the last 72 hours. Urine analysis:    Component Value Date/Time   COLORURINE YELLOW  06/02/2016 1521   APPEARANCEUR CLEAR 06/02/2016 1521   LABSPEC 1.003 (L) 06/02/2016 1521   PHURINE 5.0 06/02/2016 1521   GLUCOSEU NEGATIVE 06/02/2016 1521   GLUCOSEU NEG mg/dL 01/07/2007 2040   HGBUR NEGATIVE 06/02/2016 1521   BILIRUBINUR NEGATIVE 06/02/2016 1521   KETONESUR NEGATIVE 06/02/2016 1521   PROTEINUR NEGATIVE 06/02/2016 1521   UROBILINOGEN 0.2 07/28/2015 0103   NITRITE NEGATIVE 06/02/2016 1521   LEUKOCYTESUR NEGATIVE 06/02/2016 1521   Sepsis Labs: '@LABRCNTIP' (procalcitonin:4,lacticidven:4)  ) Recent Results (from the past 240 hour(s))  Blood Culture (routine x 2)     Status: None (Preliminary result)   Collection Time: 05/30/16  4:13 PM  Result Value Ref Range Status   Specimen Description BLOOD LEFT ARM  Final   Special Requests IN PEDIATRIC BOTTLE 1CC  Final   Culture NO GROWTH 3 DAYS  Final   Report Status PENDING  Incomplete  Blood Culture (routine x 2)     Status: None (Preliminary result)   Collection Time: 05/30/16  4:48 PM  Result Value Ref Range Status   Specimen Description BLOOD LEFT ANTECUBITAL  Final   Special Requests IN PEDIATRIC BOTTLE 2CC  Final   Culture NO GROWTH 3 DAYS  Final   Report Status PENDING  Incomplete  Urine culture     Status: Abnormal   Collection Time: 05/30/16  6:45 PM  Result Value Ref Range Status   Specimen Description URINE, RANDOM  Final   Special Requests NONE  Final   Culture <10,000 COLONIES/mL INSIGNIFICANT GROWTH (A)  Final   Report Status 05/31/2016 FINAL  Final  C difficile quick scan w PCR reflex     Status: Abnormal   Collection Time: 06/01/16 10:20 AM  Result Value Ref Range Status   C Diff antigen POSITIVE (A) NEGATIVE Final   C Diff toxin NEGATIVE NEGATIVE Final   C Diff interpretation Results are indeterminate. See PCR results.  Final  Clostridium Difficile by PCR     Status: None   Collection Time: 06/01/16 10:20 AM  Result Value Ref Range Status   Toxigenic C Difficile by pcr NEGATIVE NEGATIVE Final     Comment: Patient is colonized with non toxigenic C. difficile. May not need treatment unless significant symptoms are present.  Invalid input(s): PROCALCITONIN, LACTICACIDVEN   Radiology Studies: US Renal  Result Date: 06/02/2016 CLINICAL DATA:  Acute kidney injury, hypertension, coronary artery disease post MI, lupus, fibromyalgia, rheumatoid arthritis, smoker EXAM: RENAL / URINARY TRACT ULTRASOUND COMPLETE COMPARISON:  CT abdomen and pelvis 07/28/2015 FINDINGS: Right Kidney: Length: 10.9 cm. Normal cortical thickness and echogenicity. No mass, hydronephrosis or shadowing calcification. Left Kidney: Length: 11.2 cm. Normal cortical thickness and echogenicity. No mass, hydronephrosis, or shadowing calcification. Bladder: Appears normal for degree of bladder distention. IMPRESSION: Normal renal ultrasound. Electronically Signed   By: Lavonia Dana M.D.   On: 06/02/2016 14:55        Scheduled Meds: . aspirin EC  81 mg Oral Daily  . ciprofloxacin  400 mg Intravenous Q24H  . enoxaparin (LOVENOX) injection  30 mg Subcutaneous Q24H  . folic acid  1 mg Oral Daily  . LORazepam  0-4 mg Intravenous Q12H  . methylPREDNISolone (SOLU-MEDROL) injection  40 mg Intravenous Daily  . metoprolol tartrate  125 mg Oral BID  . metronidazole  500 mg Intravenous Q8H  . multivitamin with minerals  1 tablet Oral Daily  . nicotine  7 mg Transdermal Daily  . pantoprazole  40 mg Oral Daily  . simvastatin  20 mg Oral q1800  . sodium chloride flush  3 mL Intravenous Q12H  . thiamine  100 mg Oral Daily   Continuous Infusions: . sodium chloride 100 mL/hr at 06/03/16 1152     LOS: 3 days    Time spent: 35 minutes    Amberlin Utke A, MD [facility] Pager [phone]  If 7PM-7AM, please contact night-coverage [email or web address] Password [facility] 06/03/2016, 12:24 PM

## 2016-06-03 NOTE — Progress Notes (Addendum)
Pt left AMA. Pt was informed that it was best for her to stay to let the nephrologist evaluate her. Pt reported that she understood this, however she stated that she was going to follow-up with her primary doctor this Thursday and she would have him refer her to a kidney doctor for evaluation. Telemetry box and IV removed. MD notified. Pt left with all of her belongings. Pt left floor via ambulation.   Grant Fontana BSN, RN

## 2016-06-04 LAB — CULTURE, BLOOD (ROUTINE X 2)
Culture: NO GROWTH
Culture: NO GROWTH

## 2017-04-30 ENCOUNTER — Other Ambulatory Visit: Payer: Self-pay | Admitting: Internal Medicine

## 2017-04-30 DIAGNOSIS — Z1231 Encounter for screening mammogram for malignant neoplasm of breast: Secondary | ICD-10-CM

## 2017-05-09 ENCOUNTER — Ambulatory Visit
Admission: RE | Admit: 2017-05-09 | Discharge: 2017-05-09 | Disposition: A | Discharge status: Home / Self Care | Payer: Medicare Other | Source: Ambulatory Visit | Attending: Internal Medicine | Admitting: Internal Medicine

## 2017-05-09 DIAGNOSIS — Z1231 Encounter for screening mammogram for malignant neoplasm of breast: Secondary | ICD-10-CM

## 2017-08-01 IMAGING — CT CT ABD-PELV W/ CM
2 of 5 series · 4 of 46 positions shown, 6 images · IV contrast (Iodine)
Comparison: 03/19/2011

CLINICAL DATA: Nausea/vomiting

EXAM:
CT ABDOMEN AND PELVIS WITH CONTRAST
TECHNIQUE: Multidetector CT imaging of the abdomen and pelvis was performed
using the standard protocol following bolus administration of
intravenous contrast.
CONTRAST:  100mL OMNIPAQUE IOHEXOL 300 MG/ML  SOLN

[Series 204: coronal · coronal · 0.45mm/px · 3 of 86 slices shown, 4 images]
[im 19/86  soft-tissue]
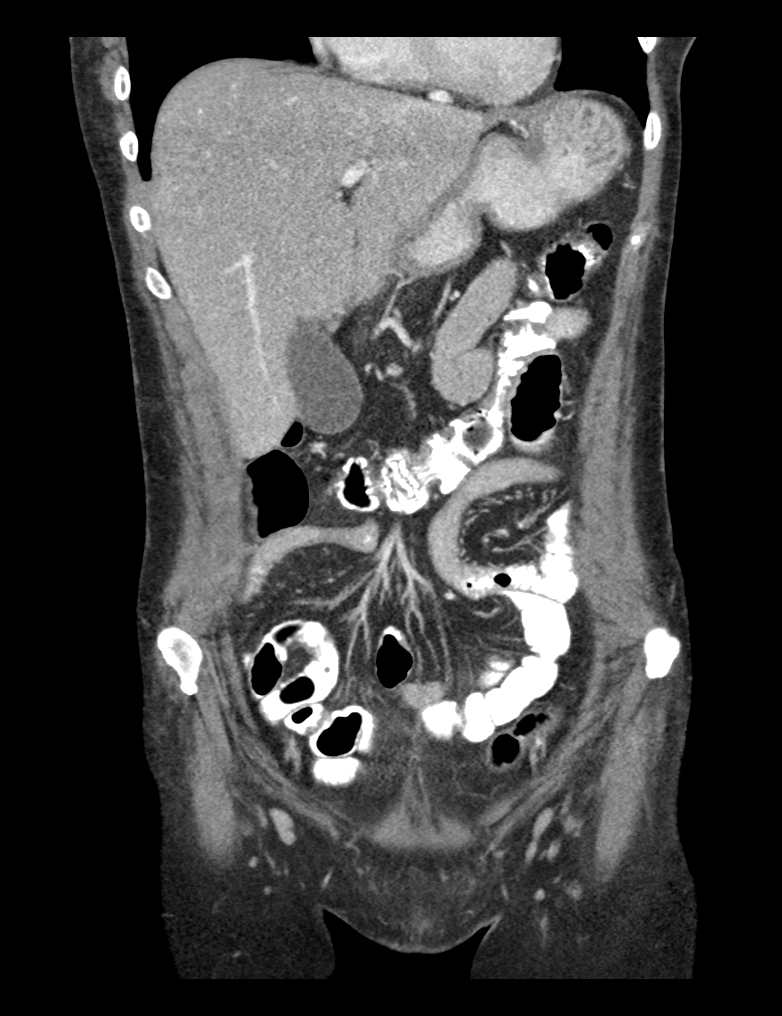
[im 19/86  bone]
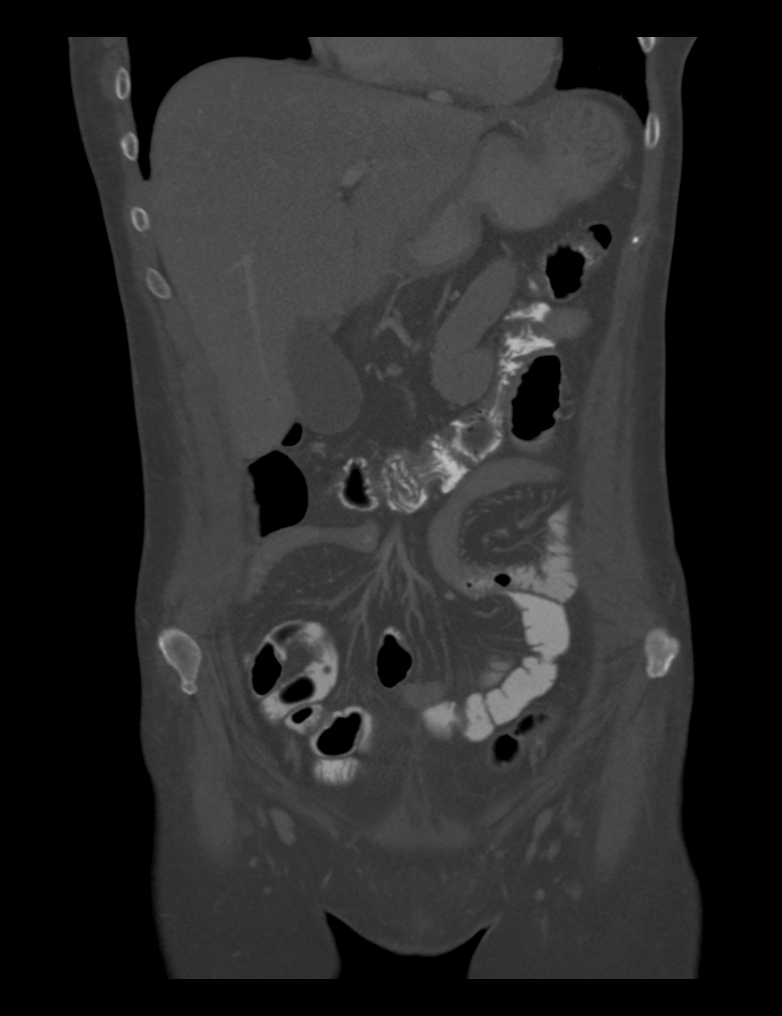
[im 48/86  soft-tissue]
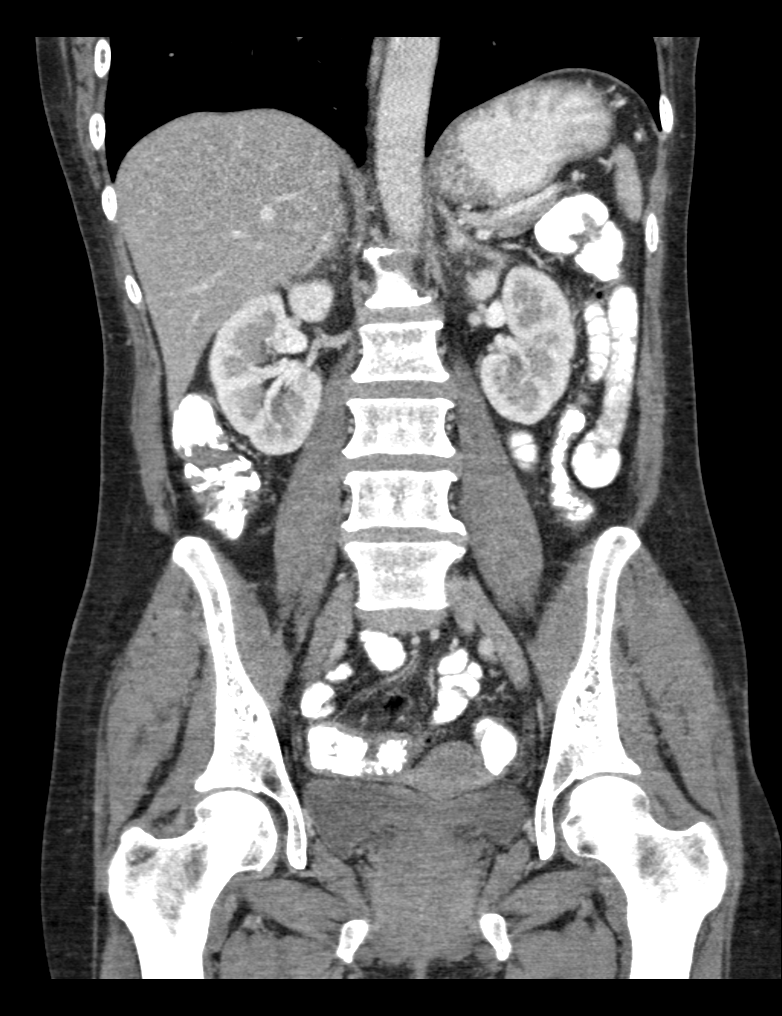
[im 67/86  soft-tissue]
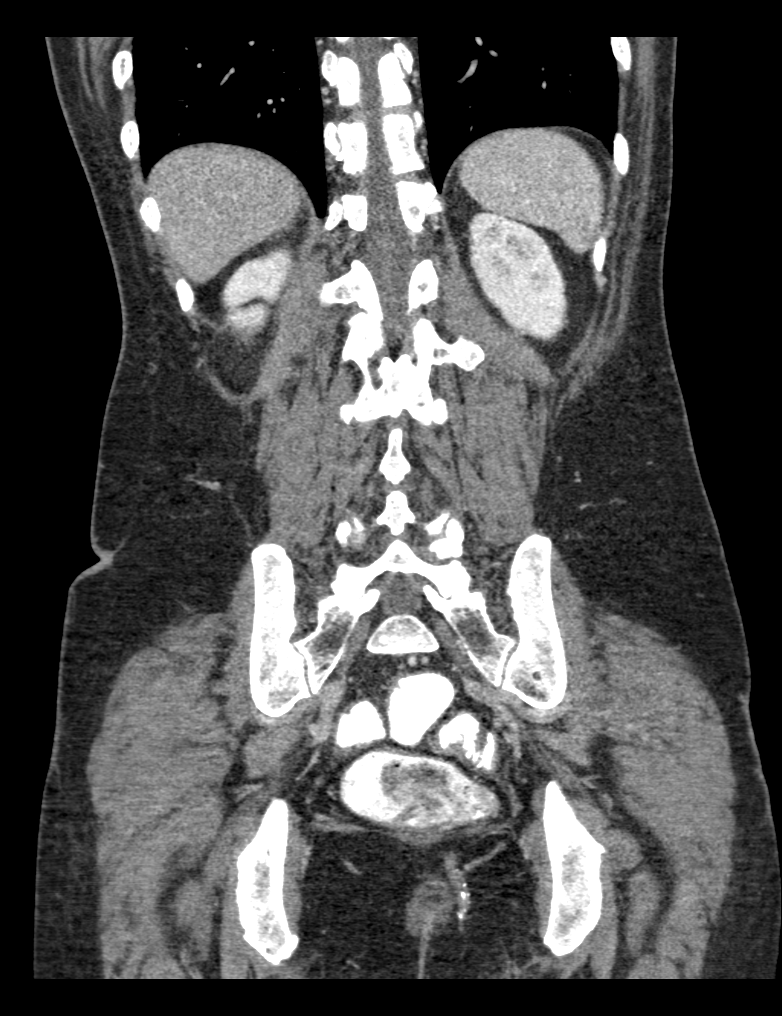

[Series 205: sagittal · sagittal · 0.45mm/px · 1 of 136 slices shown, 2 images]
[im 46/136  soft-tissue]
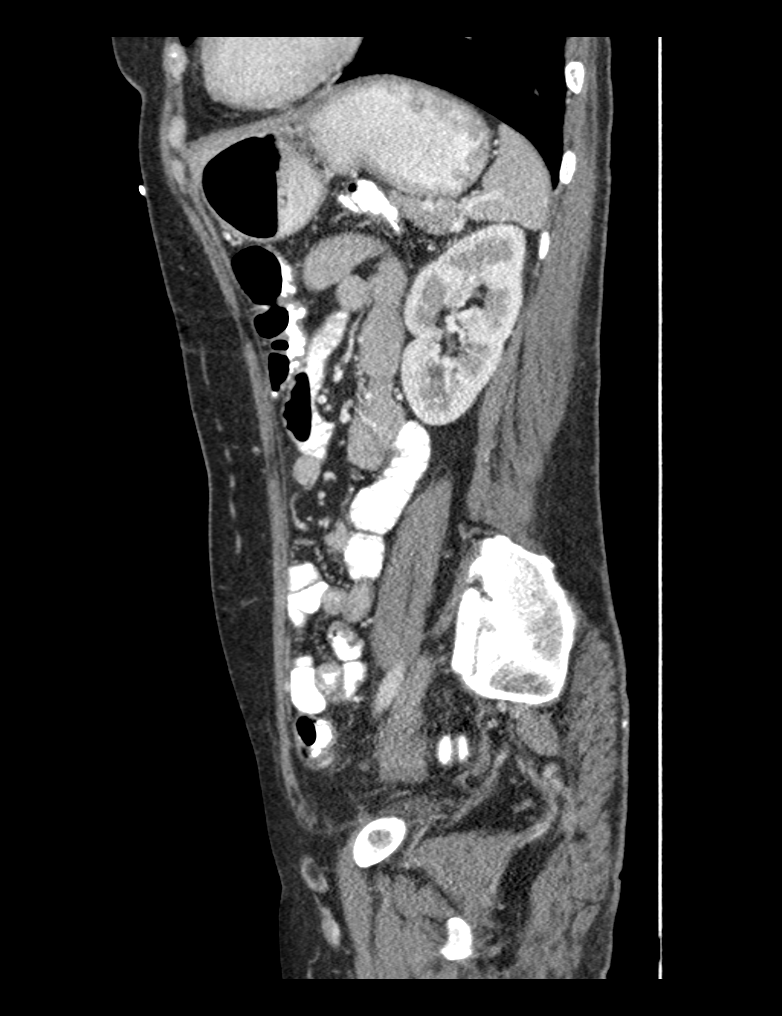
[im 46/136  bone]
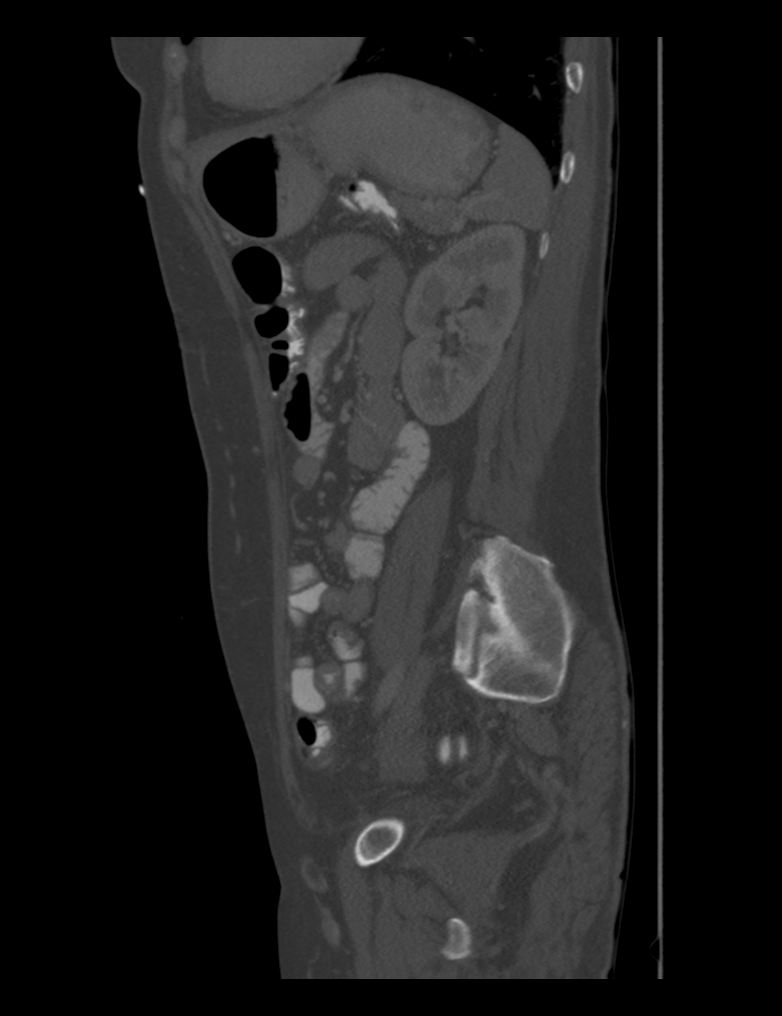

[4 of 46 positions shown; findings below may reference images not displayed]

FINDINGS: Lower chest: 3 mm subpleural nodule in the left lower lobe (series
203/image 3), unchanged since 2742, benign.

Hepatobiliary: Liver is within normal limits. No
suspicious/enhancing hepatic lesions.

Layering tiny gallstones (series 201/ image 31). No associated
inflammatory changes.

Pancreas: Within normal limits.

Spleen: Within normal limits.

Adrenals/Urinary Tract: Adrenal glands are within normal limits.

Kidneys are within normal limits.  No hydronephrosis.

Bladder is mildly thick-walled although underdistended.

Stomach/Bowel: Stomach is within normal limits.

No evidence of bowel obstruction.

Normal appendix.

Colonic wall thickening, predominantly involving the sigmoid colon
(series 201/ image 64) and possibly the ascending colon (series 201/
image 43), suggesting infectious/ inflammatory colitis.

No drainable fluid collection/ abscess.  No free air.

Vascular/Lymphatic: Atherosclerotic calcifications of the abdominal
aorta and branch vessels.

No suspicious abdominopelvic lymphadenopathy.

Reproductive: Uterus is mildly heterogeneous but grossly
unremarkable.

Bilateral ovaries are within normal limits.

Other: No abdominopelvic ascites.

Musculoskeletal: Mild degenerative changes of the visualized
thoracolumbar spine.
IMPRESSION: Sigmoid colonic wall thickening, with additional possible wall
thickening involving the right colon, suggesting infectious/
inflammatory colitis.

No evidence of bowel obstruction.  Normal appendix.

## 2017-12-10 ENCOUNTER — Other Ambulatory Visit: Payer: Self-pay | Admitting: Internal Medicine

## 2017-12-10 DIAGNOSIS — M810 Age-related osteoporosis without current pathological fracture: Secondary | ICD-10-CM

## 2018-01-05 ENCOUNTER — Other Ambulatory Visit: Payer: Self-pay | Admitting: Internal Medicine

## 2018-01-05 DIAGNOSIS — M8580 Other specified disorders of bone density and structure, unspecified site: Secondary | ICD-10-CM

## 2018-01-06 ENCOUNTER — Ambulatory Visit
Admission: RE | Admit: 2018-01-06 | Discharge: 2018-01-06 | Disposition: A | Discharge status: Home / Self Care | Payer: Medicare Other | Source: Ambulatory Visit | Attending: Internal Medicine | Admitting: Internal Medicine

## 2018-01-06 DIAGNOSIS — M8580 Other specified disorders of bone density and structure, unspecified site: Secondary | ICD-10-CM

## 2018-03-31 ENCOUNTER — Other Ambulatory Visit: Payer: Self-pay | Admitting: Internal Medicine

## 2018-03-31 DIAGNOSIS — Z1231 Encounter for screening mammogram for malignant neoplasm of breast: Secondary | ICD-10-CM

## 2018-05-11 ENCOUNTER — Ambulatory Visit: Payer: Medicare Other

## 2018-06-11 ENCOUNTER — Ambulatory Visit
Admission: RE | Admit: 2018-06-11 | Discharge: 2018-06-11 | Disposition: A | Discharge status: Home / Self Care | Payer: Medicare Other | Source: Ambulatory Visit | Attending: Internal Medicine | Admitting: Internal Medicine

## 2018-06-11 ENCOUNTER — Ambulatory Visit: Payer: Medicare Other

## 2018-06-11 DIAGNOSIS — Z1231 Encounter for screening mammogram for malignant neoplasm of breast: Secondary | ICD-10-CM

## 2019-02-18 ENCOUNTER — Other Ambulatory Visit: Payer: Self-pay

## 2019-02-18 ENCOUNTER — Ambulatory Visit (INDEPENDENT_AMBULATORY_CARE_PROVIDER_SITE_OTHER): Payer: Medicare Other | Admitting: Cardiology

## 2019-02-18 DIAGNOSIS — Z01810 Encounter for preprocedural cardiovascular examination: Secondary | ICD-10-CM

## 2019-02-18 DIAGNOSIS — I10 Essential (primary) hypertension: Secondary | ICD-10-CM | POA: Diagnosis not present

## 2019-05-31 ENCOUNTER — Other Ambulatory Visit: Payer: Self-pay | Admitting: Internal Medicine

## 2019-05-31 DIAGNOSIS — Z1231 Encounter for screening mammogram for malignant neoplasm of breast: Secondary | ICD-10-CM

## 2019-07-15 ENCOUNTER — Other Ambulatory Visit: Payer: Self-pay

## 2019-07-15 ENCOUNTER — Ambulatory Visit
Admission: RE | Admit: 2019-07-15 | Discharge: 2019-07-15 | Disposition: A | Discharge status: Home / Self Care | Payer: Medicare Other | Source: Ambulatory Visit | Attending: Internal Medicine | Admitting: Internal Medicine

## 2019-07-15 DIAGNOSIS — Z1231 Encounter for screening mammogram for malignant neoplasm of breast: Secondary | ICD-10-CM

## 2020-08-28 ENCOUNTER — Other Ambulatory Visit: Payer: Self-pay | Admitting: Internal Medicine

## 2020-08-28 DIAGNOSIS — Z1231 Encounter for screening mammogram for malignant neoplasm of breast: Secondary | ICD-10-CM

## 2020-08-29 ENCOUNTER — Ambulatory Visit
Admission: RE | Admit: 2020-08-29 | Discharge: 2020-08-29 | Disposition: A | Discharge status: Home / Self Care | Payer: Medicare Other | Source: Ambulatory Visit | Attending: Internal Medicine | Admitting: Internal Medicine

## 2020-08-29 ENCOUNTER — Other Ambulatory Visit: Payer: Self-pay

## 2020-08-29 DIAGNOSIS — Z1231 Encounter for screening mammogram for malignant neoplasm of breast: Secondary | ICD-10-CM

## 2020-12-06 ENCOUNTER — Other Ambulatory Visit: Payer: Self-pay

## 2020-12-06 ENCOUNTER — Other Ambulatory Visit (HOSPITAL_COMMUNITY)
Admission: RE | Admit: 2020-12-06 | Discharge: 2020-12-06 | Disposition: A | Discharge status: Home / Self Care | Payer: Medicare Other | Source: Ambulatory Visit | Attending: Obstetrics & Gynecology | Admitting: Obstetrics & Gynecology

## 2020-12-06 ENCOUNTER — Ambulatory Visit (INDEPENDENT_AMBULATORY_CARE_PROVIDER_SITE_OTHER): Payer: Medicare Other | Admitting: Obstetrics & Gynecology

## 2020-12-06 ENCOUNTER — Encounter: Payer: Self-pay | Admitting: Obstetrics & Gynecology

## 2020-12-06 VITALS — BP 135/87 | HR 79 | Ht 67.0 in | Wt 125.4 lb

## 2020-12-06 DIAGNOSIS — Z01419 Encounter for gynecological examination (general) (routine) without abnormal findings: Secondary | ICD-10-CM

## 2020-12-06 NOTE — Patient Instructions (Signed)
Preventive Care 84-60 Years Old, Female Preventive care refers to lifestyle choices and visits with your health care provider that can promote health and wellness. This includes:  A yearly physical exam. This is also called an annual wellness visit.  Regular dental and eye exams.  Immunizations.  Screening for certain conditions.  Healthy lifestyle choices, such as: ? Eating a healthy diet. ? Getting regular exercise. ? Not using drugs or products that contain nicotine and tobacco. ? Limiting alcohol use. What can I expect for my preventive care visit? Physical exam Your health care provider will check your:  Height and weight. These may be used to calculate your BMI (body mass index). BMI is a measurement that tells if you are at a healthy weight.  Heart rate and blood pressure.  Body temperature.  Skin for abnormal spots. Counseling Your health care provider may ask you questions about your:  Past medical problems.  Family's medical history.  Alcohol, tobacco, and drug use.  Emotional well-being.  Home life and relationship well-being.  Sexual activity.  Diet, exercise, and sleep habits.  Work and work Statistician.  Access to firearms.  Method of birth control.  Menstrual cycle.  Pregnancy history. What immunizations do I need? Vaccines are usually given at various ages, according to a schedule. Your health care provider will recommend vaccines for you based on your age, medical history, and lifestyle or other factors, such as travel or where you work.   What tests do I need? Blood tests  Lipid and cholesterol levels. These may be checked every 5 years, or more often if you are over 3 years old.  Hepatitis C test.  Hepatitis B test. Screening  Lung cancer screening. You may have this screening every year starting at age 73 if you have a 30-pack-year history of smoking and currently smoke or have quit within the past 15 years.  Colorectal cancer  screening. ? All adults should have this screening starting at age 52 and continuing until age 17. ? Your health care provider may recommend screening at age 49 if you are at increased risk. ? You will have tests every 1-10 years, depending on your results and the type of screening test.  Diabetes screening. ? This is done by checking your blood sugar (glucose) after you have not eaten for a while (fasting). ? You may have this done every 1-3 years.  Mammogram. ? This may be done every 1-2 years. ? Talk with your health care provider about when you should start having regular mammograms. This may depend on whether you have a family history of breast cancer.  BRCA-related cancer screening. This may be done if you have a family history of breast, ovarian, tubal, or peritoneal cancers.  Pelvic exam and Pap test. ? This may be done every 3 years starting at age 10. ? Starting at age 11, this may be done every 5 years if you have a Pap test in combination with an HPV test. Other tests  STD (sexually transmitted disease) testing, if you are at risk.  Bone density scan. This is done to screen for osteoporosis. You may have this scan if you are at high risk for osteoporosis. Talk with your health care provider about your test results, treatment options, and if necessary, the need for more tests. Follow these instructions at home: Eating and drinking  Eat a diet that includes fresh fruits and vegetables, whole grains, lean protein, and low-fat dairy products.  Take vitamin and mineral supplements  as recommended by your health care provider.  Do not drink alcohol if: ? Your health care provider tells you not to drink. ? You are pregnant, may be pregnant, or are planning to become pregnant.  If you drink alcohol: ? Limit how much you have to 0-1 drink a day. ? Be aware of how much alcohol is in your drink. In the [place] one drink equals one 12 oz bottle of beer (355 mL), one 5 oz glass of  wine (148 mL), or one 1 oz glass of hard liquor (44 mL).   Lifestyle  Take daily care of your teeth and gums. Brush your teeth every morning and night with fluoride toothpaste. Floss one time each day.  Stay active. Exercise for at least 30 minutes 5 or more days each week.  Do not use any products that contain nicotine or tobacco, such as cigarettes, e-cigarettes, and chewing tobacco. If you need help quitting, ask your health care provider.  Do not use drugs.  If you are sexually active, practice safe sex. Use a condom or other form of protection to prevent STIs (sexually transmitted infections).  If you do not wish to become pregnant, use a form of birth control. If you plan to become pregnant, see your health care provider for a prepregnancy visit.  If told by your health care provider, take low-dose aspirin daily starting at age 50.  Find healthy ways to cope with stress, such as: ? Meditation, yoga, or listening to music. ? Journaling. ? Talking to a trusted person. ? Spending time with friends and family. Safety  Always wear your seat belt while driving or riding in a vehicle.  Do not drive: ? If you have been drinking alcohol. Do not ride with someone who has been drinking. ? When you are tired or distracted. ? While texting.  Wear a helmet and other protective equipment during sports activities.  If you have firearms in your house, make sure you follow all gun safety procedures. What's next?  Visit your health care provider once a year for an annual wellness visit.  Ask your health care provider how often you should have your eyes and teeth checked.  Stay up to date on all vaccines. This information is not intended to replace advice given to you by your health care provider. Make sure you discuss any questions you have with your health care provider. Document Revised: 06/06/2020 Document Reviewed: 05/14/2018 Elsevier Patient Education  2021 Elsevier Inc.  

## 2020-12-06 NOTE — Progress Notes (Signed)
GYNECOLOGY ANNUAL PREVENTATIVE CARE ENCOUNTER NOTE  History:     [name] is a 60 y.o. [phone] female here for a routine annual gynecologic exam.  Current complaints: none.   Denies abnormal vaginal bleeding, discharge, pelvic pain, problems with intercourse or other gynecologic concerns.    Gynecologic History No LMP recorded. Patient is postmenopausal. Last Pap: 10/12/2014. Results were: normal with negative HPV Last mammogram: 08/29/2020. Results were: normal  Obstetric History OB History  Gravida Para Term Preterm AB Living  4 2 2  0 2 2  SAB IAB Ectopic Multiple Live Births  0 2 0 0      # Outcome Date GA Lbr Len/2nd Weight Sex Delivery Anes PTL Lv  4 IAB           3 IAB           2 Term           1 Term             Past Medical History:  Diagnosis Date  . Anxiety   . Chronic lower back pain   . Coronary artery disease   . Diverticulosis   . Fibromyalgia   . Gallstones    CT 07/28/15  . H. pylori infection   . Hyperlipidemia   . Hypertension   . Myocardial infarction ([place]) 2009  . Nausea and vomiting 05/2016  . Rheumatoid arthritis ([place])    "back and legs" (05/30/2016)  . Systemic lupus ([place])     Past Surgical History:  Procedure Laterality Date  . CORONARY ANGIOPLASTY WITH STENT PLACEMENT  2009  . MASTECTOMY Right ~ 1973   "I was born w/3 breasts"    Current Outpatient Medications on File Prior to Visit  Medication Sig Dispense Refill  . ALPRAZolam (XANAX) 0.5 MG tablet Take 0.5 mg by mouth 3 (three) times daily as needed for anxiety.    Marland Kitchen aspirin EC 81 MG tablet Take 1 tablet (81 mg total) by mouth daily. 30 tablet 0  . HYDROcodone-acetaminophen (NORCO) 7.5-325 MG tablet Take 1 tablet by mouth 3 (three) times daily.  0  . losartan (COZAAR) 100 MG tablet Take 100 mg by mouth daily.    . metoprolol (LOPRESSOR) 100 MG tablet Take 100 mg by mouth 2 (two) times daily.     . simvastatin (ZOCOR) 20 MG tablet Take 20 mg by mouth daily.     No current  facility-administered medications on file prior to visit.    Allergies  Allergen Reactions  . Other Itching    Tomatoes    Social History:  reports that she has been smoking cigarettes. She has a 10.00 pack-year smoking history. She has never used smokeless tobacco. She reports current alcohol use. She reports current drug use. Drug: Marijuana.  Family History  Problem Relation Age of Onset  . Stomach cancer Other   . Breast cancer Other   . Heart disease Other   . Stomach cancer Mother   . Breast cancer Sister   . Brain cancer Brother     The following portions of the patient's history were reviewed and updated as appropriate: allergies, current medications, past family history, past medical history, past social history, past surgical history and problem list.  Review of Systems Pertinent items noted in HPI and remainder of comprehensive ROS otherwise negative.  Physical Exam:  BP 135/87   Pulse 79   Ht 5\' 7"  (1.702 m)   Wt 125 lb 6.4 oz (56.9 kg)  BMI 19.64 kg/m  CONSTITUTIONAL: Well-developed, well-nourished female in no acute distress.  HENT:  Normocephalic, atraumatic, External right and left ear normal.  EYES: Conjunctivae and EOM are normal. Pupils are equal, round, and reactive to light. No scleral icterus.  NECK: Normal range of motion, supple, no masses.  Normal thyroid.  SKIN: Skin is warm and dry. No rash noted. Not diaphoretic. No erythema. No pallor. MUSCULOSKELETAL: Normal range of motion. No tenderness.  No cyanosis, clubbing, or edema. NEUROLOGIC: Alert and oriented to person, place, and time. Normal reflexes, muscle tone coordination.  PSYCHIATRIC: Normal mood and affect. Normal behavior. Normal judgment and thought content. CARDIOVASCULAR: Normal heart rate noted, regular rhythm RESPIRATORY: Clear to auscultation bilaterally. Effort and breath sounds normal, no problems with respiration noted. BREASTS: Symmetric in size. No masses, tenderness, skin  changes, nipple drainage, or lymphadenopathy bilaterally. Performed in the presence of a chaperone. ABDOMEN: Soft, no distention noted.  No tenderness, rebound or guarding.  PELVIC: Normal appearing external genitalia and urethral meatus with mild atrophy; normal appearing vaginal mucosa and cervix with mild atrophy.  No abnormal discharge noted.  Pap smear obtained.  Normal uterine size, no other palpable masses, no uterine or adnexal tenderness.  Performed in the presence of a chaperone.   Assessment and Plan:      1. Well woman exam with routine gynecological exam - Cytology - PAP Will follow up results of pap smear and manage accordingly. Routine preventative health maintenance measures emphasized. Please refer to After Visit Summary for other counseling recommendations.      Verita Schneiders, MD, [place] for [employer], [place]

## 2020-12-08 LAB — CYTOLOGY - PAP
Comment: NEGATIVE
Diagnosis: NEGATIVE
High risk HPV: NEGATIVE

## 2021-05-09 ENCOUNTER — Other Ambulatory Visit: Payer: Self-pay

## 2021-05-09 ENCOUNTER — Encounter (HOSPITAL_COMMUNITY): Payer: Self-pay | Admitting: Emergency Medicine

## 2021-05-09 ENCOUNTER — Emergency Department (HOSPITAL_COMMUNITY)
Admission: EM | Admit: 2021-05-09 | Discharge: 2021-05-10 | Disposition: A | Discharge status: Home / Self Care | Payer: Medicare Other | Attending: Emergency Medicine | Admitting: Emergency Medicine

## 2021-05-09 ENCOUNTER — Emergency Department (HOSPITAL_COMMUNITY): Payer: Medicare Other

## 2021-05-09 DIAGNOSIS — R112 Nausea with vomiting, unspecified: Secondary | ICD-10-CM | POA: Insufficient documentation

## 2021-05-09 DIAGNOSIS — I10 Essential (primary) hypertension: Secondary | ICD-10-CM | POA: Diagnosis not present

## 2021-05-09 DIAGNOSIS — R1032 Left lower quadrant pain: Secondary | ICD-10-CM | POA: Diagnosis not present

## 2021-05-09 DIAGNOSIS — Z79899 Other long term (current) drug therapy: Secondary | ICD-10-CM | POA: Diagnosis not present

## 2021-05-09 DIAGNOSIS — Z20822 Contact with and (suspected) exposure to covid-19: Secondary | ICD-10-CM | POA: Diagnosis not present

## 2021-05-09 DIAGNOSIS — R61 Generalized hyperhidrosis: Secondary | ICD-10-CM | POA: Insufficient documentation

## 2021-05-09 DIAGNOSIS — F17210 Nicotine dependence, cigarettes, uncomplicated: Secondary | ICD-10-CM | POA: Diagnosis not present

## 2021-05-09 DIAGNOSIS — Z7982 Long term (current) use of aspirin: Secondary | ICD-10-CM | POA: Diagnosis not present

## 2021-05-09 DIAGNOSIS — R197 Diarrhea, unspecified: Secondary | ICD-10-CM | POA: Diagnosis not present

## 2021-05-09 DIAGNOSIS — I2510 Atherosclerotic heart disease of native coronary artery without angina pectoris: Secondary | ICD-10-CM | POA: Diagnosis not present

## 2021-05-09 DIAGNOSIS — R1012 Left upper quadrant pain: Secondary | ICD-10-CM | POA: Insufficient documentation

## 2021-05-09 DIAGNOSIS — Z955 Presence of coronary angioplasty implant and graft: Secondary | ICD-10-CM | POA: Insufficient documentation

## 2021-05-09 LAB — COMPREHENSIVE METABOLIC PANEL
ALT: 24 U/L (ref 0–44)
AST: 35 U/L (ref 15–41)
Albumin: 5 g/dL (ref 3.5–5.0)
Alkaline Phosphatase: 82 U/L (ref 38–126)
Anion gap: 16 — ABNORMAL HIGH (ref 5–15)
BUN: 21 mg/dL — ABNORMAL HIGH (ref 6–20)
CO2: 24 mmol/L (ref 22–32)
Calcium: 10.4 mg/dL — ABNORMAL HIGH (ref 8.9–10.3)
Chloride: 101 mmol/L (ref 98–111)
Creatinine, Ser: 0.91 mg/dL (ref 0.44–1.00)
GFR, Estimated: >60 mL/min (ref >60)
Glucose, Bld: 165 mg/dL — ABNORMAL HIGH (ref 70–99)
Potassium: 3.8 mmol/L (ref 3.5–5.1)
Sodium: 141 mmol/L (ref 135–145)
Total Bilirubin: 0.5 mg/dL (ref 0.3–1.2)
Total Protein: 9.8 g/dL — ABNORMAL HIGH (ref 6.5–8.1)

## 2021-05-09 LAB — CBC WITH DIFFERENTIAL/PLATELET
Abs Immature Granulocytes: 0.05 10*3/uL (ref 0.00–0.07)
Basophils Absolute: 0 10*3/uL (ref 0.0–0.1)
Basophils Relative: 0 %
Eosinophils Absolute: 0 10*3/uL (ref 0.0–0.5)
Eosinophils Relative: 0 %
HCT: 42.8 % (ref 36.0–46.0)
Hemoglobin: 14.3 g/dL (ref 12.0–15.0)
Immature Granulocytes: 1 %
Lymphocytes Relative: 17 %
Lymphs Abs: 1.8 10*3/uL (ref 0.7–4.0)
MCH: 30.1 pg (ref 26.0–34.0)
MCHC: 33.4 g/dL (ref 30.0–36.0)
MCV: 90.1 fL (ref 80.0–100.0)
Monocytes Absolute: 0.4 10*3/uL (ref 0.1–1.0)
Monocytes Relative: 4 %
Neutro Abs: 8.1 10*3/uL — ABNORMAL HIGH (ref 1.7–7.7)
Neutrophils Relative %: 78 %
Platelets: 444 10*3/uL — ABNORMAL HIGH (ref 150–400)
RBC: 4.75 MIL/uL (ref 3.87–5.11)
RDW: 13.5 % (ref 11.5–15.5)
WBC: 10.4 10*3/uL (ref 4.0–10.5)
nRBC: 0 % (ref 0.0–0.2)

## 2021-05-09 LAB — LIPASE, BLOOD: Lipase: 36 U/L (ref 11–51)

## 2021-05-09 MED ORDER — ONDANSETRON 4 MG PO TBDP
4.0000 mg | ORAL_TABLET | Freq: Once | ORAL | Status: AC
  Administered 2021-05-09: 4 mg via ORAL
  Filled 2021-05-09: qty 1

## 2021-05-09 MED ORDER — THIAMINE HCL 100 MG/ML IJ SOLN: 100.0000 mg | Freq: Every day | INTRAMUSCULAR | Status: DC

## 2021-05-09 MED ORDER — SODIUM CHLORIDE 0.9 % IV SOLN
12.5000 mg | Freq: Four times a day (QID) | INTRAVENOUS | Status: DC | PRN
  Administered 2021-05-10: 12.5 mg via INTRAVENOUS
  Filled 2021-05-09: qty 0.5

## 2021-05-09 MED ORDER — SODIUM CHLORIDE 0.9 % IV BOLUS
1000.0000 mL | Freq: Once | INTRAVENOUS | Status: AC
  Administered 2021-05-09: 1000 mL via INTRAVENOUS

## 2021-05-09 NOTE — ED Notes (Signed)
RN alert by NT that pt was vomiting in waiting room. ODT Zofran ordered

## 2021-05-09 NOTE — ED Provider Notes (Signed)
[facility] EMERGENCY DEPARTMENT Provider Note   CSN: [id number] Arrival date & time: 05/09/21  1958     History Chief Complaint  Patient presents with   Nausea   Emesis    [name] is a 60 y.o. female with a hx of SLE, RA, hypertension, hyperlipidemia, fibromyalgia, CAD, EtOH dependence, and gallstones who presents to the ED with complaints of N/V today. Patient states she started having nausea with emesis that began @ 1500 today, reports approximately 10 episodes of emesis, not able to keep anything down. Received zofran ODT from triage team without relief. Reports abdominal pain with vomiting. Has had a few days of loose stool and has noted some sneezing & coughing. No other alleviating/aggravating factors to patient's sxs. Denies fever, hematemesis, melena, hematemesis, dysuria, dyspnea, chest pain, or syncope.No sick contacts w/ similar sxs. Patient is vaccinated & boosted against covid 19. Denies recent foreign travel or abx. Patient states she has not been taking any of her prescription medicines. Her son stepped outside of the exam room with me for additional hx- he relays that the patient has been drinking daily for 5 days now, sometimes binge drinks like this but does not always drink daily, no definitive hx of alcohol withdrawal or Dts, but she does get sick like this when she is binge drinking per his report.   HPI     Past Medical History:  Diagnosis Date   Anxiety    Chronic lower back pain    Coronary artery disease    Diverticulosis    Fibromyalgia    Gallstones    CT 07/28/15   H. pylori infection    Hyperlipidemia    Hypertension    Myocardial infarction ([place]) 2009   Nausea and vomiting 05/2016   Rheumatoid arthritis ([place])    "back and legs" (05/30/2016)   Systemic lupus ([place])     Patient Active Problem List   Diagnosis Date Noted   AKI (acute kidney injury) ([place]) 06/01/2016   Hypokalemia 05/30/2016   Hypomagnesemia 05/30/2016    Abdominal pain 05/30/2016   Alcohol dependency ([place]) 05/30/2016   SLE (systemic lupus erythematosus related syndrome) ([place])    Dehydration    Intractable vomiting with nausea 07/27/2015   Lupus ([place]) 07/27/2015   Nausea & vomiting 07/27/2015   Diarrhea 07/27/2015   Atypical chest pain 07/27/2015   Hypercalcemia 07/27/2015   INSOMNIA, CHRONIC 02/16/2009   LEUKOCYTOSIS UNSPECIFIED 11/22/2008   MYOCARDIAL INFARCTION, HX OF 99991111   NEOPLASM UNCERTAIN BHV OTH&UNSPEC FE GENIT ORGN 03/03/2008   TOBACCO ABUSE 03/03/2008   OTHER SPECIFIED DISORDERS OF LIVER 03/03/2008   BACK PAIN, CHRONIC 01/07/2007   FREQUENCY, URINARY 01/07/2007   LIPODYSTROPHY 09/01/2006   MYOPATHY 09/01/2006   Essential hypertension 09/01/2006   GERD 09/01/2006   MENOPAUSAL SYNDROME 09/01/2006   Systemic lupus erythematosus ([place]) 09/01/2006   LOW BACK PAIN 09/01/2006   Myalgia and myositis, unspecified 09/01/2006   OSTEOPENIA 09/01/2006    Past Surgical History:  Procedure Laterality Date   CORONARY ANGIOPLASTY WITH STENT PLACEMENT  2009   MASTECTOMY Right ~ 1973   "I was born w/3 breasts"     OB History     Gravida  4   Para  2   Term  2   Preterm  0   AB  2   Living  2      SAB  0   IAB  2   Ectopic  0   Multiple  0  Live Births              Family History  Problem Relation Age of Onset   Stomach cancer Other    Breast cancer Other    Heart disease Other    Stomach cancer Mother    Breast cancer Sister    Brain cancer Brother     Social History   Tobacco Use   Smoking status: Every Day    Packs/day: 0.25    Years: 40.00    Pack years: 10.00    Types: Cigarettes   Smokeless tobacco: Never  Vaping Use   Vaping Use: Never used  Substance Use Topics   Alcohol use: Yes    Alcohol/week: 0.0 standard drinks    Comment: beer   Drug use: Yes    Types: Marijuana    Comment: 05/30/2016 "maybe a pull q 2 wks"    Home Medications Prior to Admission medications    Medication Sig Start Date End Date Taking? Authorizing Provider  ALPRAZolam Duanne Moron) 0.5 MG tablet Take 0.5 mg by mouth 3 (three) times daily as needed for anxiety.    [provider]  aspirin EC 81 MG tablet Take 1 tablet (81 mg total) by mouth daily. 09/21/13   Baker, Freeman Caldron, PA-C  HYDROcodone-acetaminophen (NORCO) 7.5-325 MG tablet Take 1 tablet by mouth 3 (three) times daily. 07/03/15   [provider]  losartan (COZAAR) 100 MG tablet Take 100 mg by mouth daily.    [provider]  metoprolol (LOPRESSOR) 100 MG tablet Take 100 mg by mouth 2 (two) times daily.     [provider]  simvastatin (ZOCOR) 20 MG tablet Take 20 mg by mouth daily.    [provider]    Allergies    Other  Review of Systems   Review of Systems  Constitutional:  Positive for fatigue. Negative for fever.  HENT:  Positive for sneezing.   Respiratory:  Positive for cough. Negative for shortness of breath.   Cardiovascular:  Negative for chest pain.  Gastrointestinal:  Positive for abdominal pain, diarrhea, nausea and vomiting. Negative for anal bleeding, blood in stool and constipation.  Genitourinary:  Negative for dysuria.  Neurological:  Negative for syncope.  All other systems reviewed and are negative.  Physical Exam Updated Vital Signs BP (!) 190/107 (BP Location: Left Arm)   Pulse 89   Temp 97.7 F (36.5 C) (Oral)   Resp (!) 22   Ht '5\' 7"'$  (1.702 m)   Wt 58 kg   SpO2 99%   BMI 20.03 kg/m   Physical Exam Vitals and nursing note reviewed.  Constitutional:      General: She is not in acute distress.    Appearance: She is well-developed. She is diaphoretic. She is not toxic-appearing.  HENT:     Head: Normocephalic and atraumatic.  Eyes:     General:        Right eye: No discharge.        Left eye: No discharge.     Conjunctiva/sclera: Conjunctivae normal.  Cardiovascular:     Rate and Rhythm: Normal rate and regular rhythm.  Pulmonary:      Effort: Pulmonary effort is normal. No respiratory distress.     Breath sounds: Normal breath sounds. No wheezing, rhonchi or rales.  Abdominal:     General: There is no distension.     Palpations: Abdomen is soft.     Tenderness: There is abdominal tenderness (LUQ, LLQ). There is no  right CVA tenderness, left CVA tenderness, guarding or rebound.  Musculoskeletal:     Cervical back: Neck supple.  Skin:    General: Skin is warm.     Findings: No rash.  Neurological:     Mental Status: She is alert.     Comments: Clear speech.   Psychiatric:        Behavior: Behavior normal.    ED Results / Procedures / Treatments   Labs (all labs ordered are listed, but only abnormal results are displayed) Labs Reviewed  CBC WITH DIFFERENTIAL/PLATELET - Abnormal; Notable for the following components:      Result Value   Platelets 444 (*)    Neutro Abs 8.1 (*)    All other components within normal limits  COMPREHENSIVE METABOLIC PANEL - Abnormal; Notable for the following components:   Glucose, Bld 165 (*)    BUN 21 (*)    Calcium 10.4 (*)    Total Protein 9.8 (*)    Anion gap 16 (*)    All other components within normal limits  RESP PANEL BY RT-PCR (FLU A&B, COVID) ARPGX2  LIPASE, BLOOD  URINALYSIS, ROUTINE W REFLEX MICROSCOPIC    EKG None  Radiology CT Abdomen Pelvis W Contrast  Result Date: 05/10/2021 CLINICAL DATA:  Abdominal pain with nausea vomiting for several hours, initial encounter EXAM: CT ABDOMEN AND PELVIS WITH CONTRAST TECHNIQUE: Multidetector CT imaging of the abdomen and pelvis was performed using the standard protocol following bolus administration of intravenous contrast. CONTRAST:  29m OMNIPAQUE IOHEXOL 350 MG/ML SOLN COMPARISON:  07/28/2015 FINDINGS: Lower chest: No acute abnormality. Hepatobiliary: Fatty infiltration of the liver is noted. The gallbladder is well distended without definitive cholelithiasis. Pancreas: Unremarkable. No pancreatic ductal dilatation or  surrounding inflammatory changes. Spleen: Normal in size without focal abnormality. Adrenals/Urinary Tract: Adrenal glands are within normal limits. Kidneys demonstrate a normal enhancement pattern bilaterally. Normal excretion of contrast is noted bilaterally. No calculi or obstructive changes are noted. Bladder is partially distended. Stomach/Bowel: Colon is predominately decompressed. Scattered mild diverticular changes noted without diverticulitis. The appendix is within normal limits. Small bowel and stomach are unremarkable. Vascular/Lymphatic: Aortic atherosclerosis. No enlarged abdominal or pelvic lymph nodes. Reproductive: Uterus and bilateral adnexa are unremarkable. Other: No abdominal wall hernia or abnormality. No abdominopelvic ascites. Musculoskeletal: Degenerative changes of lumbar spine are noted. IMPRESSION: Fatty liver. Diverticulosis without diverticulitis. No acute abnormality is noted. Electronically Signed   By: MInez CatalinaM.D.   On: 05/10/2021 02:00   DG Chest Portable 1 View  Result Date: 05/09/2021 CLINICAL DATA:  Cough, nausea, vomiting, diarrhea, diaphoresis, tachycardia EXAM: PORTABLE CHEST 1 VIEW COMPARISON:  05/30/2016 FINDINGS: Single frontal view of the chest demonstrates an unremarkable cardiac silhouette. No acute airspace disease, effusion, or pneumothorax. No acute bony abnormality. Indeterminate metallic density overlying the cardiac apex, likely on or beneath the patient. IMPRESSION: 1. No acute intrathoracic process. Electronically Signed   By: MRanda NgoM.D.   On: 05/09/2021 23:39    Procedures Procedures   Medications Ordered in ED Medications  ondansetron (ZOFRAN-ODT) disintegrating tablet 4 mg (4 mg Oral Given 05/09/21 2216)    ED Course  I have reviewed the triage vital signs and the nursing notes.  Pertinent labs & imaging results that were available during my care of the patient were reviewed by me and considered in my medical decision making (see  chart for details).    MDM Rules/Calculators/A&P  Patient presents to the ED with complaints of abdominal pain. Patient nontoxic appearing, in no apparent distress, vitals w/ elevated BP- had not been taking her BP meds for awhile now. LUQ/LLQ abdominal tenderness without peritoneal signs.    Additional history obtained:  Additional history obtained from chart review & nursing note review.   Lab Tests:  I Ordered, reviewed, and interpreted labs, which included:  CBC: Thrombocytosis without leukocytosis or anemia.  CMP: Mild elevation in BUN, calcium, glucose, anion gap, and protein.  No significant electrolyte derangement. Lipase: Within normal limits Ethanol level: Negative COVID/flu: Negative  Imaging Studies ordered:  I ordered imaging studies which included CXR & CT abdomen/pelvis with contrast, I independently reviewed, formal radiology impression shows:  CXR: No acute intrathoracic process CT A/p; Fatty liver. Diverticulosis without diverticulitis. No acute abnormality is noted.   ED Course:  Initial CIWA 13.  Ethanol level is 0. Patient given fluids, Phenergan, and subsequently Ativan. Her labs show that I suspect are findings suggestive of a degree of dehydration, she has no significant electrolyte derangement, acute kidney injury, or acute hepatic failure.  Her lipase is within normal limits.  No leukocytosis.  Chest x-ray without infiltrate to suggest pneumonia.  COVID/flu test are negative.  CT abdomen/pelvis with contrast without acute surgical process.  Repeat abdominal exam remains without peritoneal signs.  Patient is feeling improved following above interventions, she is tolerating p.o., she is ambulatory. CIWA improved.  She overall appears appropriate for discharge home with supportive care at this time, will also refill her HTN & HLD medications- encouraged her to take these and to have her BP rechecked by primary care. I discussed results,  treatment plan, need for follow-up, and return precautions with the patient and her son at bedside. Provided opportunity for questions, patient & her son confirmed understanding and are in agreement with plan.   Portions of this note were generated with L[employer]. Dictation errors may occur despite best attempts at proofreading.   Final Clinical Impression(s) / ED Diagnoses Final diagnoses:  Nausea vomiting and diarrhea    Rx / DC Orders ED Discharge Orders          Ordered    simvastatin (ZOCOR) 20 MG tablet  Daily        05/10/21 0442    losartan (COZAAR) 100 MG tablet  Daily        05/10/21 0442    ondansetron (ZOFRAN ODT) 4 MG disintegrating tablet  Every 8 hours PRN        05/10/21 0442    sucralfate (CARAFATE) 1 GM/10ML suspension  3 times daily with meals & bedtime        05/10/21 0442    pantoprazole (PROTONIX) 20 MG tablet  Daily        05/10/21 0442             Amaryllis Dyke, PA-C 05/10/21 [id number]    Ezequiel Essex, MD 05/10/21 [phone]

## 2021-05-09 NOTE — ED Provider Notes (Signed)
Emergency Medicine Provider Triage Evaluation Note  [name] , a 60 y.o. female  was evaluated in triage.  Pt complains of n/v/d.  Review of Systems  Positive: N/v/d, cough, congestion Negative: Cp, sob, abd pain, fever, or chills  Physical Exam  BP (!) 151/116   Pulse 85   Temp 99 F (37.2 C)   Resp 16   Ht '5\' 7"'$  (1.702 m)   Wt 58 kg   SpO2 100%   BMI 20.03 kg/m  Gen:   Awake, no distress   Resp:  Normal effort  MSK:   Moves extremities without difficulty  Other:    Medical Decision Making  Medically screening exam initiated at 8:15 PM.  Appropriate orders placed.  [name] was informed that the remainder of the evaluation will be completed by another provider, this initial triage assessment does not replace that evaluation, and the importance of remaining in the ED until their evaluation is complete.  Pt developed cough, congestion, n/v/d today without body aches, cp, abd pain or sob.     Domenic Moras, PA-C 05/09/21 2016    Lajean Saver, MD 05/09/21 [phone]

## 2021-05-09 NOTE — ED Triage Notes (Signed)
Pt here via [facility] from home for N/V/D that started 3-4 hours ago. Hx of lupus and HTN, does not take meds. Per EMS pt was diaphoretic and tachycardic.

## 2021-05-10 ENCOUNTER — Emergency Department (HOSPITAL_COMMUNITY): Payer: Medicare Other

## 2021-05-10 DIAGNOSIS — R112 Nausea with vomiting, unspecified: Secondary | ICD-10-CM | POA: Diagnosis not present

## 2021-05-10 LAB — RESP PANEL BY RT-PCR (FLU A&B, COVID) ARPGX2
Influenza A by PCR: NEGATIVE
Influenza B by PCR: NEGATIVE
SARS Coronavirus 2 by RT PCR: NEGATIVE

## 2021-05-10 LAB — ETHANOL: Alcohol, Ethyl (B): <10 mg/dL (ref <10)

## 2021-05-10 MED ORDER — IOHEXOL 350 MG/ML SOLN
75.0000 mL | Freq: Once | INTRAVENOUS | Status: AC | PRN
  Administered 2021-05-10: 75 mL via INTRAVENOUS

## 2021-05-10 MED ORDER — LORAZEPAM 1 MG PO TABS: 0.0000 mg | ORAL_TABLET | Freq: Four times a day (QID) | ORAL | Status: DC

## 2021-05-10 MED ORDER — LOSARTAN POTASSIUM 100 MG PO TABS: 100.0000 mg | ORAL_TABLET | Freq: Every day | ORAL | 0 refills | Status: AC

## 2021-05-10 MED ORDER — SIMVASTATIN 20 MG PO TABS: 20.0000 mg | ORAL_TABLET | Freq: Every day | ORAL | 0 refills | Status: AC

## 2021-05-10 MED ORDER — ONDANSETRON 4 MG PO TBDP: 4.0000 mg | ORAL_TABLET | Freq: Three times a day (TID) | ORAL | 0 refills | Status: AC | PRN

## 2021-05-10 MED ORDER — LORAZEPAM 2 MG/ML IJ SOLN: 0.0000 mg | Freq: Two times a day (BID) | INTRAMUSCULAR | Status: DC

## 2021-05-10 MED ORDER — SUCRALFATE 1 GM/10ML PO SUSP: 1.0000 g | Freq: Three times a day (TID) | ORAL | 0 refills | Status: AC

## 2021-05-10 MED ORDER — LORAZEPAM 2 MG/ML IJ SOLN
0.0000 mg | Freq: Four times a day (QID) | INTRAMUSCULAR | Status: DC
  Administered 2021-05-10: 2 mg via INTRAVENOUS
  Filled 2021-05-10: qty 1

## 2021-05-10 MED ORDER — PANTOPRAZOLE SODIUM 20 MG PO TBEC: 20.0000 mg | DELAYED_RELEASE_TABLET | Freq: Every day | ORAL | 0 refills | Status: AC

## 2021-05-10 MED ORDER — LORAZEPAM 1 MG PO TABS: 0.0000 mg | ORAL_TABLET | Freq: Two times a day (BID) | ORAL | Status: DC

## 2021-05-10 NOTE — Discharge Instructions (Addendum)
You were seen in the Er today for vomiting & diarrhea.  Your CT scan did not show any acute problems- you do have diverticulosis and fatty liver changes.  Your labs showed elevated blood sugar, protein, and calcium- have these rechecked by primary care, you were given fluids to help with this. Avoid foods rich in calcium until your labs are rechecked.   We are sending you home with the following medications to help with your symptoms:  - Protonix- please take 1 tablet in the morning prior to any meals to help with stomach acidity/pain.  - Carafate- please take prior to each meal and prior to bedtime to help with stomach acidity/pain.  - Zofran- please take every 8 hours as needed for nausea/vomiting.   We have prescribed you new medication(s) today. Discuss the medications prescribed today with your pharmacist as they can have adverse effects and interactions with your other medicines including over the counter and prescribed medications. Seek medical evaluation if you start to experience new or abnormal symptoms after taking one of these medicines, seek care immediately if you start to experience difficulty breathing, feeling of your throat closing, facial swelling, or rash as these could be indications of a more serious allergic reaction  Please follow attached diet guidelines.   Follow up with your primary care provider within 3 days for re-evaluation.  Return to the ER for new or worsening symptoms including but not limited to worsened pain, new pain, inability to keep fluids down, blood in vomit/stool, passing out, or any other concerns.

## 2021-05-10 NOTE — ED Notes (Signed)
Patient ambulated well with no assistance to the bathroom and back to room.

## 2021-05-10 NOTE — ED Notes (Signed)
.  Patient verbalizes understanding of discharge instructions. Prescriptions reviewed with pt and son. Opportunity for questioning and answers were provided. Armband removed by staff, pt discharged from ED ambulatory with son.

## 2021-07-23 ENCOUNTER — Other Ambulatory Visit: Payer: Self-pay | Admitting: Internal Medicine

## 2021-07-23 DIAGNOSIS — Z1231 Encounter for screening mammogram for malignant neoplasm of breast: Secondary | ICD-10-CM

## 2021-08-31 ENCOUNTER — Ambulatory Visit: Payer: Medicare Other

## 2021-10-02 ENCOUNTER — Ambulatory Visit: Payer: Medicare Other

## 2021-10-05 ENCOUNTER — Ambulatory Visit
Admission: RE | Admit: 2021-10-05 | Discharge: 2021-10-05 | Disposition: A | Discharge status: Home / Self Care | Payer: Medicare Other | Source: Ambulatory Visit | Attending: Internal Medicine | Admitting: Internal Medicine

## 2021-10-05 DIAGNOSIS — Z1231 Encounter for screening mammogram for malignant neoplasm of breast: Secondary | ICD-10-CM

## 2022-11-14 ENCOUNTER — Other Ambulatory Visit: Payer: Self-pay | Admitting: Internal Medicine

## 2022-11-14 DIAGNOSIS — Z1231 Encounter for screening mammogram for malignant neoplasm of breast: Secondary | ICD-10-CM

## 2023-01-06 ENCOUNTER — Ambulatory Visit
Admission: RE | Admit: 2023-01-06 | Discharge: 2023-01-06 | Disposition: A | Discharge status: Home / Self Care | Payer: 59 | Source: Ambulatory Visit | Attending: Internal Medicine | Admitting: Internal Medicine

## 2023-01-06 DIAGNOSIS — Z1231 Encounter for screening mammogram for malignant neoplasm of breast: Secondary | ICD-10-CM

## 2023-10-14 ENCOUNTER — Encounter (HOSPITAL_COMMUNITY): Payer: Self-pay

## 2023-10-14 ENCOUNTER — Ambulatory Visit (HOSPITAL_COMMUNITY)
Admission: EM | Admit: 2023-10-14 | Discharge: 2023-10-14 | Disposition: A | Discharge status: Home / Self Care | Payer: 59 | Attending: Emergency Medicine | Admitting: Emergency Medicine

## 2023-10-14 DIAGNOSIS — Z8679 Personal history of other diseases of the circulatory system: Secondary | ICD-10-CM

## 2023-10-14 DIAGNOSIS — F17200 Nicotine dependence, unspecified, uncomplicated: Secondary | ICD-10-CM | POA: Diagnosis not present

## 2023-10-14 DIAGNOSIS — J029 Acute pharyngitis, unspecified: Secondary | ICD-10-CM

## 2023-10-14 LAB — POCT RAPID STREP A (OFFICE): Rapid Strep A Screen: NEGATIVE

## 2023-10-14 NOTE — ED Provider Notes (Addendum)
[facility]    CSN: [id number] Arrival date & time: 10/14/23  0803      History   Chief Complaint Chief Complaint  Patient presents with   Sore Throat    HPI [name] is a 63 y.o. female.   63 year old female, [name], presents to urgent care for evaluation of sore throat right ear pain headache and chills for past 4 days patient states she has been taking Mucinex at home.  She has a past medical history of hypertension ,coronary artery disease, hyperlipidemia, MI, lupus, rheumatoid arthritis, fibromyalgia  The history is provided by the patient. No language interpreter was used.    Past Medical History:  Diagnosis Date   Anxiety    Chronic lower back pain    Coronary artery disease    Diverticulosis    Fibromyalgia    Gallstones    CT 07/28/15   H. pylori infection    Hyperlipidemia    Hypertension    Myocardial infarction (HCC) 2009   Nausea and vomiting 05/2016   Rheumatoid arthritis (HCC)    "back and legs" (05/30/2016)   Systemic lupus (HCC)     Patient Active Problem List   Diagnosis Date Noted   Viral pharyngitis 10/14/2023   History of hypertension 10/14/2023   AKI (acute kidney injury) (HCC) 06/01/2016   Hypokalemia 05/30/2016   Hypomagnesemia 05/30/2016   Abdominal pain 05/30/2016   Alcohol dependency (HCC) 05/30/2016   SLE (systemic lupus erythematosus related syndrome) (HCC)    Dehydration    Intractable vomiting with nausea 07/27/2015   Lupus 07/27/2015   Nausea & vomiting 07/27/2015   Diarrhea 07/27/2015   Atypical chest pain 07/27/2015   Hypercalcemia 07/27/2015   INSOMNIA, CHRONIC 02/16/2009   LEUKOCYTOSIS UNSPECIFIED 11/22/2008   MYOCARDIAL INFARCTION, HX OF 07/04/2008   NEOPLASM UNCERTAIN BHV OTH&UNSPEC FE GENIT ORGN 03/03/2008   Heavy smoker 03/03/2008   OTHER SPECIFIED DISORDERS OF LIVER 03/03/2008   BACK PAIN, CHRONIC 01/07/2007   FREQUENCY, URINARY 01/07/2007   LIPODYSTROPHY 09/01/2006   MYOPATHY  09/01/2006   Essential hypertension 09/01/2006   GERD 09/01/2006   MENOPAUSAL SYNDROME 09/01/2006   Systemic lupus erythematosus (HCC) 09/01/2006   LOW BACK PAIN 09/01/2006   Myalgia and myositis, unspecified 09/01/2006   OSTEOPENIA 09/01/2006    Past Surgical History:  Procedure Laterality Date   CORONARY ANGIOPLASTY WITH STENT PLACEMENT  2009   MASTECTOMY Right ~ 1973   "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 Authorizing Provider  ALPRAZolam Prudy Feeler) 0.5 MG tablet Take 0.5 mg by mouth 3 (three) times daily as needed for anxiety.    [provider]  aspirin EC 81 MG tablet Take 1 tablet (81 mg total) by mouth daily. 09/21/13   Graylon Good, PA-C  cyclobenzaprine (FLEXERIL) 10 MG tablet Take 10 mg by mouth daily as needed for muscle spasms. 04/04/21   [provider]  HYDROcodone-acetaminophen (NORCO) 7.5-325 MG tablet Take 1 tablet by  mouth 3 (three) times daily. 07/03/15   [provider]  losartan (COZAAR) 100 MG tablet Take 1 tablet (100 mg total) by mouth daily. 05/10/21   Petrucelli, Samantha R, PA-C  ondansetron (ZOFRAN ODT) 4 MG disintegrating tablet Take 1 tablet (4 mg total) by mouth every 8 (eight) hours as needed for nausea or vomiting. 05/10/21   Petrucelli, Samantha R, PA-C  pantoprazole (PROTONIX) 20 MG tablet Take 1 tablet (20 mg total) by mouth daily. 05/10/21   Petrucelli, Samantha R, PA-C  simvastatin (ZOCOR) 20 MG tablet Take 1 tablet (20 mg total) by mouth daily. 05/10/21   Petrucelli, Samantha R, PA-C  sucralfate (CARAFATE) 1 GM/10ML suspension Take 10 mLs (1 g total) by mouth 4 (four) times daily -  with meals and at bedtime. 05/10/21   Petrucelli, Samantha R, PA-C  traZODone  (DESYREL) 50 MG tablet Take 50 mg by mouth at bedtime. 04/04/21   [provider]    Family History Family History  Problem Relation Age of Onset   Stomach cancer Other    Breast cancer Other    Heart disease Other    Stomach cancer Mother    Breast cancer Sister    Brain cancer Brother     Social History Social History   Tobacco Use   Smoking status: Every Day    Current packs/day: 0.25    Average packs/day: 0.3 packs/day for 40.0 years (10.0 ttl pk-yrs)    Types: Cigarettes   Smokeless tobacco: Never  Vaping Use   Vaping status: Never Used  Substance Use Topics   Alcohol use: Yes    Alcohol/week: 0.0 standard drinks of alcohol    Comment: beer   Drug use: Yes    Types: Marijuana    Comment: 05/30/2016 "maybe a pull q 2 wks"     Allergies   Other   Review of Systems Review of Systems  Constitutional:  Positive for chills. Negative for fever.  HENT:  Positive for ear pain and sore throat.   All other systems reviewed and are negative.    Physical Exam Triage Vital Signs ED Triage Vitals  Encounter Vitals Group     BP 10/14/23 0825 (!) 172/104     Systolic BP Percentile --      Diastolic BP Percentile --      Pulse Rate 10/14/23 0825 99     Resp 10/14/23 0825 16     Temp 10/14/23 0825 98.5 F (36.9 C)     Temp Source 10/14/23 0825 Oral     SpO2 10/14/23 0825 98 %     Weight --      Height --      Head Circumference --      Peak Flow --      Pain Score 10/14/23 0827 8     Pain Loc --      Pain Education --      Exclude from Growth Chart --    No data found.  Updated Vital Signs BP (!) 172/104 (BP Location: Right Arm)   Pulse 99   Temp 98.5 F (36.9 C) (Oral)   Resp 16   SpO2 98%   Visual Acuity Right Eye Distance:   Left Eye Distance:   Bilateral Distance:    Right Eye Near:   Left Eye Near:    Bilateral Near:     Physical Exam Vitals and nursing note reviewed.  Constitutional:      General: She is not in acute  distress.    Appearance: She is well-developed and well-groomed.  HENT:     Head: Normocephalic and atraumatic.     Mouth/Throat:     Pharynx: Uvula midline. Posterior oropharyngeal erythema present.     Tonsils: No tonsillar exudate or tonsillar abscesses.  Eyes:     Conjunctiva/sclera: Conjunctivae normal.  Cardiovascular:     Rate and Rhythm: Normal rate and regular rhythm.     Pulses: Normal pulses.     Heart sounds: Normal heart sounds. No murmur heard. Pulmonary:     Effort: Pulmonary effort is normal. No respiratory distress.     Breath sounds: Normal breath sounds and air entry.  Abdominal:     Palpations: Abdomen is soft.     Tenderness: There is no abdominal tenderness.  Musculoskeletal:        General: No swelling.     Cervical back: Neck supple.  Skin:    General: Skin is warm and dry.     Capillary Refill: Capillary refill takes less than 2 seconds.  Neurological:     Mental Status: She is alert.  Psychiatric:        Mood and Affect: Mood normal.        Behavior: Behavior is cooperative.      UC Treatments / Results  Labs (all labs ordered are listed, but only abnormal results are displayed) Labs Reviewed  CULTURE, GROUP A STREP [facility])  POCT RAPID STREP A (OFFICE)    EKG   Radiology No results found.  Procedures Procedures (including critical care time)  Medications Ordered in UC Medications - No data to display  Initial Impression / Assessment and Plan / UC Course  I have reviewed the triage vital signs and the nursing notes.  Pertinent labs & imaging results that were available during my care of the patient were reviewed by me and considered in my medical decision making (see chart for details).    Discussed exam findings and plan of care with patient, if throat culture is positive will need script for amoxicillin at that time, strict go to ER precautions given. Patient verbalized understanding to this provider.  Ddx: Sore throat, viral  illness, elevatd blood pressure reading Final Clinical Impressions(s) / UC Diagnoses   Final diagnoses:  Viral pharyngitis  Heavy smoker  History of hypertension     Discharge Instructions      Your strep test s negative, culture pending. Check my chart for results, if your culture is positive we will call ou in an antibiotic. Most likely you have a viral illness: no antibiotic is indicated at this time, May treat with OTC meds of choice(Coricidin HBP, tylenol, chloraseptic throat lozenges, etc). Take home meds as regularly prescribed,stopped smoking. Make sure to drink plenty of fluids to stay hydrated(gatorade, water, popsicles,jello,etc), avoid caffeine products. Follow up with PCP. Return as needed.     ED Prescriptions   None    PDMP not reviewed this encounter.   Clancy Gourd, NP 10/14/23 [id number]    Clancy Gourd, NP 10/14/23 [id number]    Clancy Gourd, NP 10/14/23 [phone]

## 2023-10-14 NOTE — ED Triage Notes (Signed)
Pt states sore throat,right ear pain,headache and chills for the past 4 days.  States she has been taking Mucinex at home.

## 2023-10-14 NOTE — Discharge Instructions (Addendum)
Your strep test s negative, culture pending. Check my chart for results, if your culture is positive we will call ou in an antibiotic. Most likely you have a viral illness: no antibiotic is indicated at this time, May treat with OTC meds of choice(Coricidin HBP, tylenol, chloraseptic throat lozenges, etc). Take home meds as regularly prescribed,stopped smoking. Make sure to drink plenty of fluids to stay hydrated(gatorade, water, popsicles,jello,etc), avoid caffeine products. Follow up with PCP. Return as needed.

## 2023-10-18 LAB — CULTURE, GROUP A STREP (THRC)

## 2023-12-01 ENCOUNTER — Other Ambulatory Visit: Payer: Self-pay | Admitting: Internal Medicine

## 2023-12-01 DIAGNOSIS — Z1231 Encounter for screening mammogram for malignant neoplasm of breast: Secondary | ICD-10-CM

## 2024-01-08 ENCOUNTER — Ambulatory Visit

## 2024-01-13 ENCOUNTER — Ambulatory Visit
Admission: RE | Admit: 2024-01-13 | Discharge: 2024-01-13 | Disposition: A | Discharge status: Home / Self Care | Source: Ambulatory Visit | Attending: Internal Medicine | Admitting: Internal Medicine

## 2024-01-13 DIAGNOSIS — Z1231 Encounter for screening mammogram for malignant neoplasm of breast: Secondary | ICD-10-CM
